# Patient Record
Sex: Female | Born: 1990
Health system: Southern US, Community
[De-identification: ages and names within clinical notes are randomized; demographics above are authoritative.]

## PROBLEM LIST (undated history)

## (undated) DIAGNOSIS — N63 Unspecified lump in unspecified breast: Secondary | ICD-10-CM

## (undated) DIAGNOSIS — F419 Anxiety disorder, unspecified: Secondary | ICD-10-CM

## (undated) DIAGNOSIS — G43909 Migraine, unspecified, not intractable, without status migrainosus: Secondary | ICD-10-CM

## (undated) DIAGNOSIS — A6000 Herpesviral infection of urogenital system, unspecified: Secondary | ICD-10-CM

## (undated) DIAGNOSIS — F329 Major depressive disorder, single episode, unspecified: Secondary | ICD-10-CM

## (undated) HISTORY — PX: CHOLECYSTECTOMY: SHX55

## (undated) HISTORY — DX: Herpesviral infection of urogenital system, unspecified: A60.00

## (undated) HISTORY — DX: Unspecified lump in unspecified breast: N63.0

## (undated) HISTORY — DX: Anxiety disorder, unspecified: F41.9

## (undated) HISTORY — DX: Major depressive disorder, single episode, unspecified: F32.9

---
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
## 2010-08-12 ENCOUNTER — Ambulatory Visit: Payer: Self-pay | Admitting: Family Medicine

## 2011-12-15 ENCOUNTER — Ambulatory Visit: Payer: Self-pay | Admitting: Emergency Medicine

## 2011-12-15 LAB — HEPATIC FUNCTION PANEL A (ARMC)
Albumin: 4.4 g/dL (ref 3.4–5.0)
Alkaline Phosphatase: 67 U/L (ref 50–136)
Bilirubin, Direct: 0.1 mg/dL (ref 0.00–0.20)
Bilirubin,Total: 0.5 mg/dL (ref 0.2–1.0)
SGOT(AST): 24 U/L (ref 15–37)
Total Protein: 8 g/dL (ref 6.4–8.2)

## 2011-12-16 ENCOUNTER — Ambulatory Visit: Payer: Self-pay | Admitting: Emergency Medicine

## 2011-12-19 LAB — PATHOLOGY REPORT

## 2012-03-25 LAB — HM PAP SMEAR: HM Pap smear: NEGATIVE

## 2013-02-16 IMAGING — CR DG CHOLANGIOGRAM OPERATIVE
1 series · 10 of 10 positions shown · non-contrast
Comparison: none

REASON FOR EXAM: Cholelithiasis
COMMENTS:

PROCEDURE:     DXR - DXR CHOLANGIOGRAM OP (INITIAL)  - December 16, 2011  [DATE]
RESULT:     Contrast is visualized in the common duct and hepatic ducts.
Contrast is noted to flow in the duodenum without evidence of obstruction.
No retained stone is seen. There is no dilatation of the common duct.

[Series 3: cont. · 10 of 39 frames shown]
[frame 1/39]
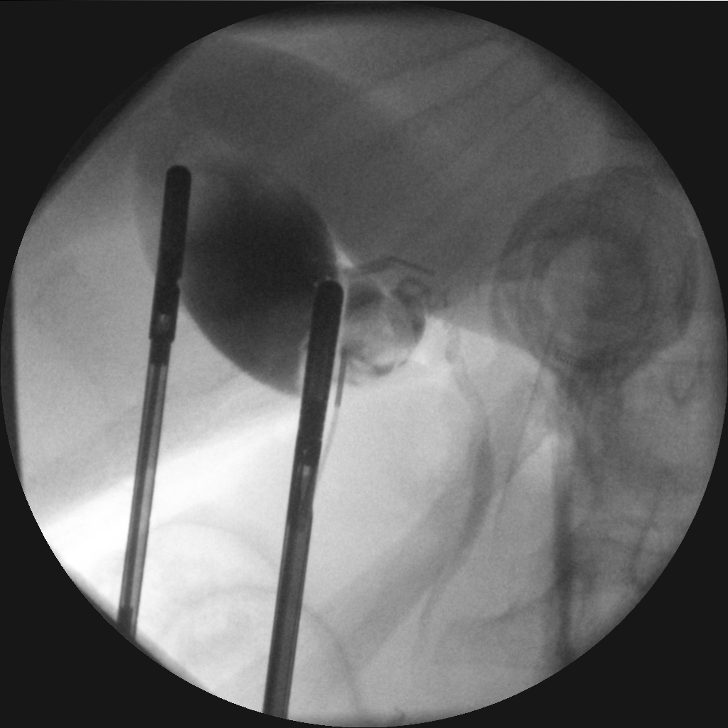
[frame 5/39]
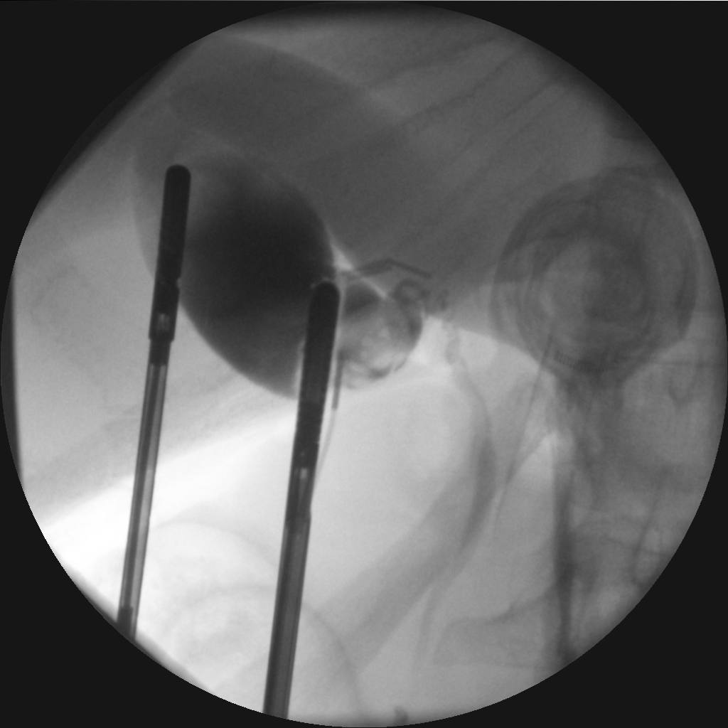
[frame 9/39]
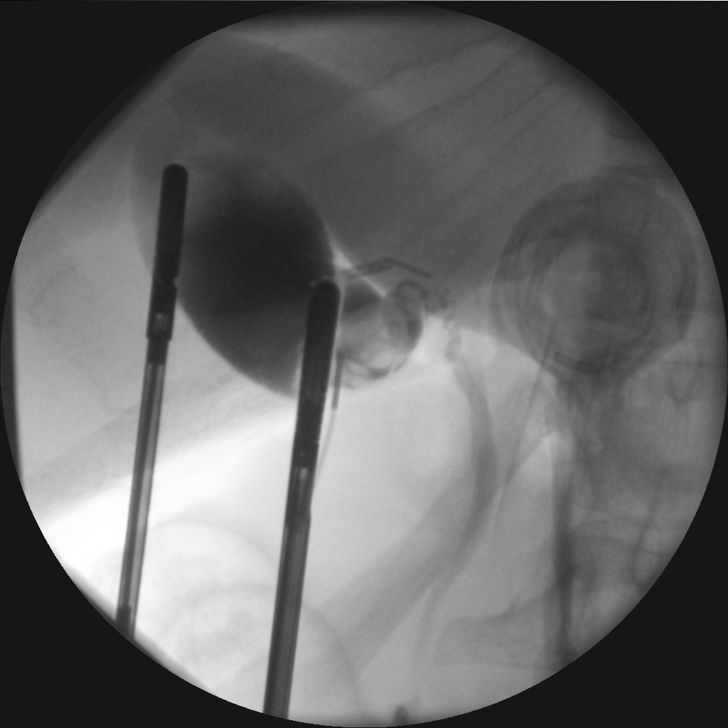
[frame 13/39]
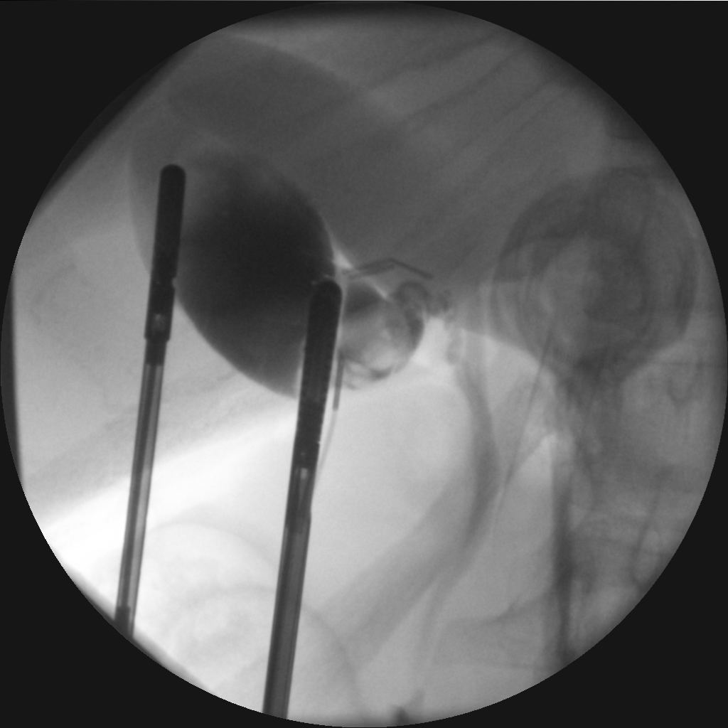
[frame 17/39]
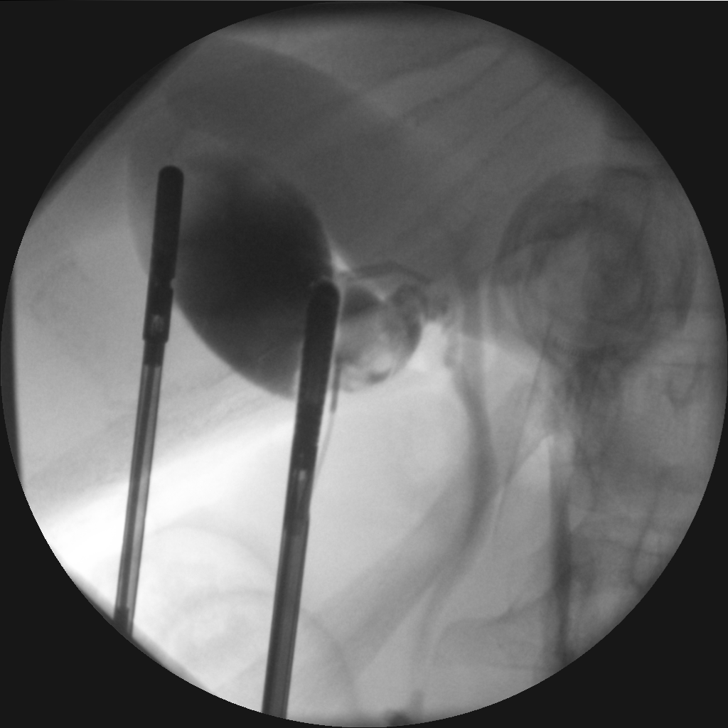
[frame 22/39]
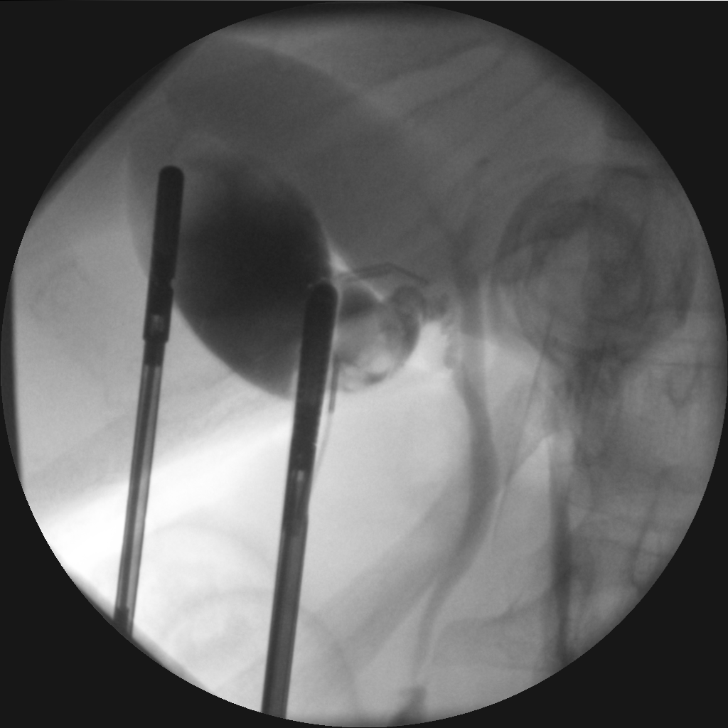
[frame 26/39]
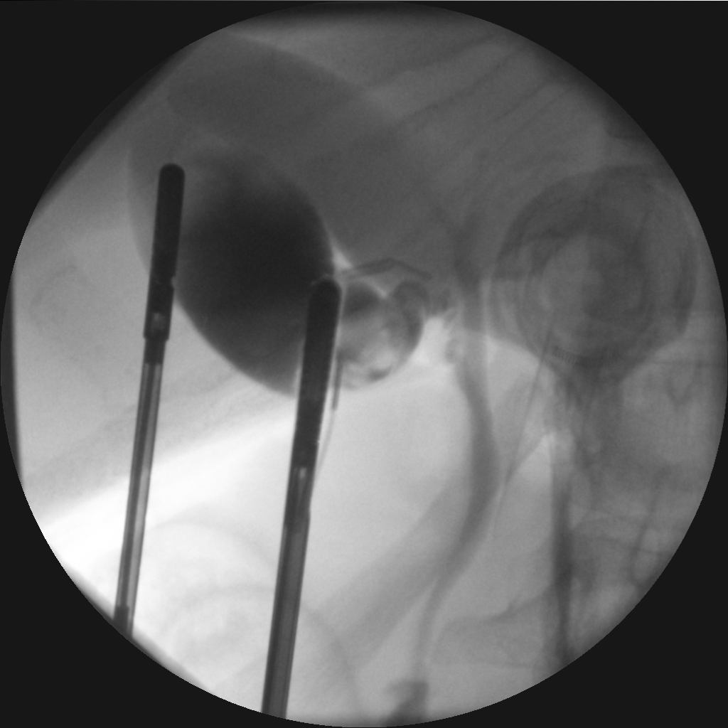
[frame 30/39]
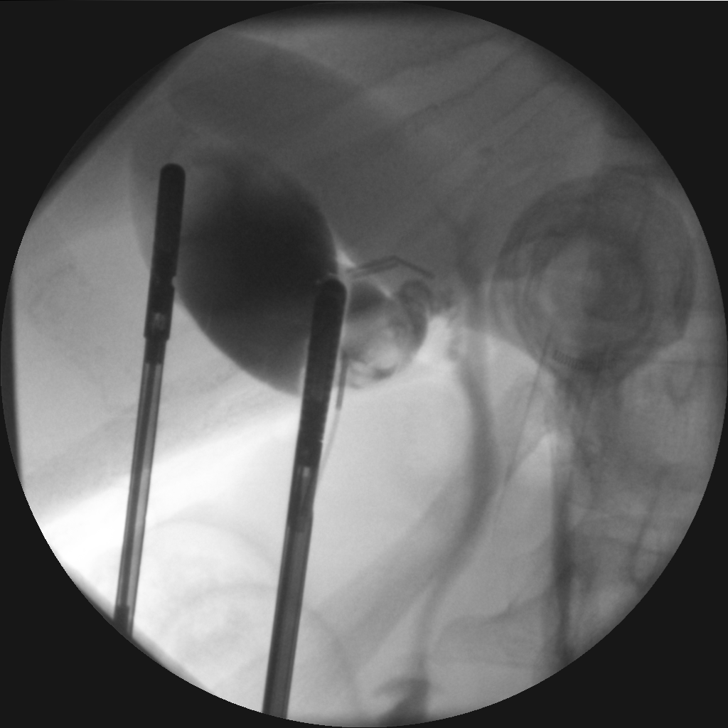
[frame 34/39]
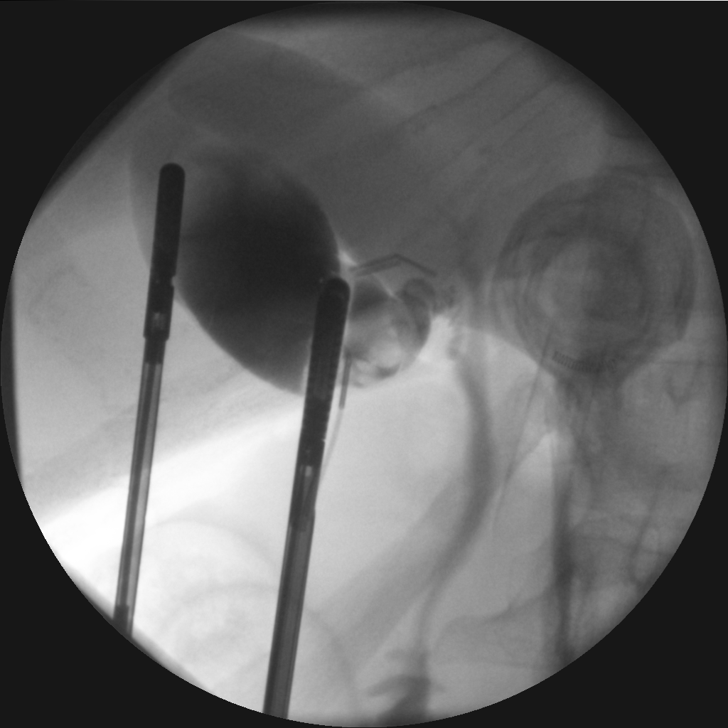
[frame 39/39]
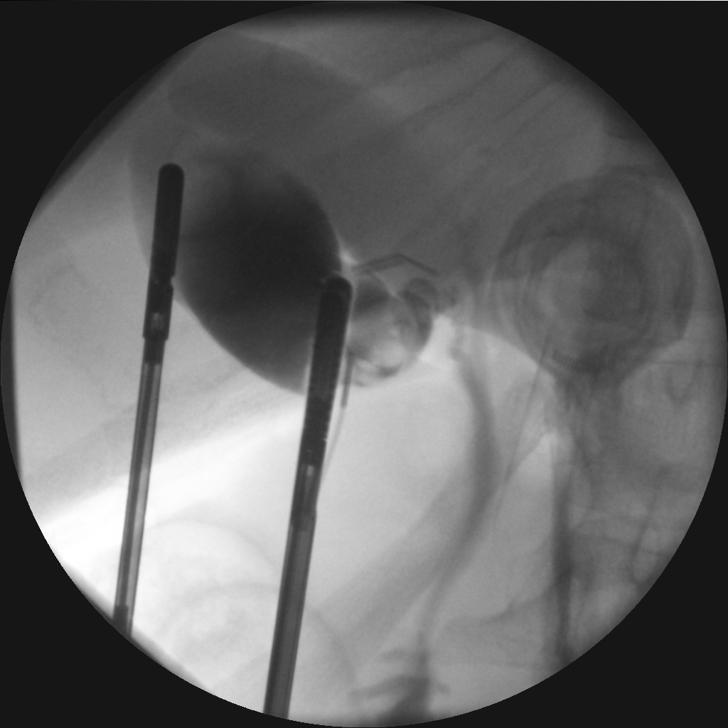

[10 of 10 positions shown; findings below may reference images not displayed]

IMPRESSION: 1.     Normal operative cholangiogram.

## 2013-03-20 ENCOUNTER — Other Ambulatory Visit: Payer: Self-pay

## 2013-03-20 LAB — HCG, QUANTITATIVE, PREGNANCY: Beta Hcg, Quant.: 9453 m[IU]/mL — ABNORMAL HIGH

## 2013-09-29 ENCOUNTER — Ambulatory Visit: Payer: Self-pay

## 2013-10-20 DIAGNOSIS — N63 Unspecified lump in unspecified breast: Secondary | ICD-10-CM

## 2013-10-20 HISTORY — DX: Unspecified lump in unspecified breast: N63.0

## 2013-11-02 ENCOUNTER — Observation Stay: Payer: Self-pay

## 2013-11-16 ENCOUNTER — Observation Stay: Payer: Self-pay

## 2013-11-19 ENCOUNTER — Inpatient Hospital Stay: Payer: Self-pay | Admitting: Obstetrics and Gynecology

## 2013-11-19 LAB — CBC WITH DIFFERENTIAL/PLATELET
BASOS PCT: 0.7 %
Basophil #: 0.1 10*3/uL (ref 0.0–0.1)
EOS PCT: 1.2 %
Eosinophil #: 0.2 10*3/uL (ref 0.0–0.7)
HCT: 34.2 % — AB (ref 35.0–47.0)
HGB: 11.1 g/dL — AB (ref 12.0–16.0)
LYMPHS PCT: 19 %
Lymphocyte #: 2.6 10*3/uL (ref 1.0–3.6)
MCH: 26.2 pg (ref 26.0–34.0)
MCHC: 32.5 g/dL (ref 32.0–36.0)
MCV: 81 fL (ref 80–100)
Monocyte #: 1.3 x10 3/mm — ABNORMAL HIGH (ref 0.2–0.9)
Monocyte %: 9.3 %
NEUTROS PCT: 69.8 %
Neutrophil #: 9.5 10*3/uL — ABNORMAL HIGH (ref 1.4–6.5)
Platelet: 220 10*3/uL (ref 150–440)
RBC: 4.23 10*6/uL (ref 3.80–5.20)
RDW: 14.8 % — ABNORMAL HIGH (ref 11.5–14.5)
WBC: 13.6 10*3/uL — AB (ref 3.6–11.0)

## 2013-11-20 LAB — GC/CHLAMYDIA PROBE AMP

## 2013-11-21 LAB — HEMATOCRIT: HCT: 28.1 % — ABNORMAL LOW (ref 35.0–47.0)

## 2013-11-26 ENCOUNTER — Ambulatory Visit: Payer: Self-pay | Admitting: Family Medicine

## 2013-11-26 LAB — RAPID INFLUENZA A&B ANTIGENS (ARMC ONLY)

## 2013-11-29 ENCOUNTER — Ambulatory Visit: Payer: Self-pay | Admitting: General Surgery

## 2013-12-01 ENCOUNTER — Other Ambulatory Visit: Payer: Medicaid Other

## 2013-12-01 ENCOUNTER — Encounter: Payer: Self-pay | Admitting: General Surgery

## 2013-12-01 ENCOUNTER — Ambulatory Visit (INDEPENDENT_AMBULATORY_CARE_PROVIDER_SITE_OTHER): Payer: Medicaid Other | Admitting: General Surgery

## 2013-12-01 VITALS — BP 98/62 | HR 72 | Resp 12 | Ht 67.0 in | Wt 186.0 lb

## 2013-12-01 DIAGNOSIS — N61 Mastitis without abscess: Secondary | ICD-10-CM

## 2013-12-01 NOTE — Progress Notes (Signed)
Patient ID: Nichole Winters, female   DOB: 1991-05-31, 23 y.o.   MRN: [id number]  Chief Complaint  Patient presents with  . Other    New Patient right breast mastitis on antibiotics, large mass at 4 o'clock right breast    HPI Nichole DunningsChristina R Winters is a 23 y.o. female.  Here today for breast evaluation. Referred by Dossie DerErika Howard NP for right breast mastitis on antibiotics, large mass at 4 o'clock right breast. She is postpartum 11 days. States she started having right breast pain last Friday and went to the ER on Saturday. She has been taking antibiotic since Monday. She states the redness has improved.  HPI  History reviewed. No pertinent past medical history.  Past Surgical History  Procedure Laterality Date  . Cholecystectomy      History reviewed. No pertinent family history.  Social History History  Substance Use Topics  . Smoking status: Former Smoker -- 1.00 packs/day for 1 years    Types: Cigarettes  . Smokeless tobacco: Never Used  . Alcohol Use: Yes    No Known Allergies  Current Outpatient Prescriptions  Medication Sig Dispense Refill  . dicloxacillin (DYNAPEN) 250 MG capsule Take 250 mg by mouth 4 (four) times daily.      . ferrous fumarate (HEMOCYTE - 106 MG FE) 325 (106 FE) MG TABS tablet Take 1 tablet by mouth.      Marland Kitchen. ibuprofen (ADVIL,MOTRIN) 100 MG tablet Take 100 mg by mouth every 6 (six) hours as needed for fever.      Marland Kitchen. oxyCODONE-acetaminophen (PERCOCET/ROXICET) 5-325 MG per tablet Take 1 tablet by mouth every 4 (four) hours as needed for severe pain.      . Prenatal Vit-Fe Fumarate-FA (PRENATAL PO) Take 2 tablets by mouth daily.       No current facility-administered medications for this visit.    Review of Systems Review of Systems  Constitutional: Negative.   Respiratory: Negative.   Cardiovascular: Negative.     Blood pressure 98/62, pulse 72, resp. rate 12, height 5\' 7"  (1.702 m), weight 186 lb (84.369 kg), last menstrual period  02/17/2013.  Physical Exam Physical Exam  Constitutional: She is oriented to person, place, and time. She appears well-developed and well-nourished.  Eyes: Conjunctivae are normal.  Cardiovascular: Normal rate, regular rhythm and normal heart sounds.   Pulmonary/Chest: Effort normal and breath sounds normal.  Both breast markedly engorged no sign of redness or sign of abscess.  Lymphadenopathy:    She has no cervical adenopathy.    She has no axillary adenopathy.  Neurological: She is alert and oriented to person, place, and time.  Skin: Skin is warm and dry.    Data Reviewed    Assessment    Mastitis by recent history- no findings of abscess or ongoing infection. Both breast markedly engorged, no sign of redness or sign of abscess.     Plan    Complete her course of antibiotic. Call for any new problems.       Aaren Atallah G 12/02/2013, 9:27 AM

## 2013-12-01 NOTE — Patient Instructions (Addendum)
Continue self breast exams. Call office for any new breast issues or concerns. Complete antibiotic therapy If redness or swelling worsens call and make appointment

## 2013-12-02 ENCOUNTER — Encounter: Payer: Self-pay | Admitting: General Surgery

## 2013-12-26 ENCOUNTER — Ambulatory Visit: Payer: Self-pay | Admitting: Family Medicine

## 2014-01-04 ENCOUNTER — Encounter: Payer: Self-pay | Admitting: General Surgery

## 2014-01-04 ENCOUNTER — Ambulatory Visit (INDEPENDENT_AMBULATORY_CARE_PROVIDER_SITE_OTHER): Payer: Medicaid Other | Admitting: General Surgery

## 2014-01-04 VITALS — BP 116/66 | HR 74 | Resp 12 | Ht 67.0 in | Wt 180.0 lb

## 2014-01-04 DIAGNOSIS — N644 Mastodynia: Secondary | ICD-10-CM

## 2014-01-04 NOTE — Progress Notes (Signed)
Patient ID: Nichole Winters, female   DOB: Jul 09, 1991, 23 y.o.   MRN: [id number]  Chief Complaint  Patient presents with  . Follow-up    right breast mass    HPI Nichole DunningsChristina R Winters is a 23 y.o. female who presents for a follow up evaluation of a right breast mass. The patient states the area is still the same size but is having pain in the area. The mass is located in the central outer quadrant of the right breast. The patient states she has stopped breast feeding. She states the pain is worse during her monthly cycle. The soreness has gone away since her period has stopped.   HPI  Past Medical History  Diagnosis Date  . Breast mass 2015    right breast    Past Surgical History  Procedure Laterality Date  . Cholecystectomy      History reviewed. No pertinent family history.  Social History History  Substance Use Topics  . Smoking status: Former Smoker -- 1.00 packs/day for 1 years    Types: Cigarettes  . Smokeless tobacco: Never Used  . Alcohol Use: Yes    No Known Allergies  Current Outpatient Prescriptions  Medication Sig Dispense Refill  . ibuprofen (ADVIL,MOTRIN) 100 MG tablet Take 100 mg by mouth every 6 (six) hours as needed for fever.       No current facility-administered medications for this visit.    Review of Systems Review of Systems  Constitutional: Negative.   Respiratory: Negative.   Cardiovascular: Negative.     Blood pressure 116/66, pulse 74, resp. rate 12, height 5\' 7"  (1.702 m), weight 180 lb (81.647 kg), last menstrual period 12/28/2013.  Physical Exam Physical Exam  Pulmonary/Chest: Right breast exhibits no inverted nipple, no mass, no nipple discharge, no skin change and no tenderness. Left breast exhibits no inverted nipple, no mass, no nipple discharge, no skin change and no tenderness.  Patient still has a little milk present.   Lymphadenopathy:    She has no cervical adenopathy.    She has no axillary adenopathy.  There is no  palpable mass, redness or tenderness in right breast  Data Reviewed  None.   Assessment    Stable exam with no findings of concern.     Plan    Patient to return as needed.        SANKAR,SEEPLAPUTHUR G 01/06/2014, 5:39 AM

## 2014-01-04 NOTE — Patient Instructions (Signed)
Patient to return as needed. The patient is aware to call back for any questions or concerns. 

## 2014-01-06 ENCOUNTER — Encounter: Payer: Self-pay | Admitting: General Surgery

## 2014-02-14 DIAGNOSIS — Z30430 Encounter for insertion of intrauterine contraceptive device: Secondary | ICD-10-CM | POA: Insufficient documentation

## 2014-08-21 ENCOUNTER — Encounter: Payer: Self-pay | Admitting: General Surgery

## 2015-02-11 NOTE — Op Note (Signed)
PATIENT NAME:  Nichole Winters, Nichole Winters MR#:  [id number] DATE OF BIRTH:  Mar 04, 1991  DATE OF PROCEDURE:  12/16/2011  PREOPERATIVE DIAGNOSIS: Acute cholecystitis, cholelithiasis.   POSTOPERATIVE DIAGNOSIS:  Acute cholecystitis, cholelithiasis.   PROCEDURE PERFORMED: Laparoscopic cholecystectomy with cholangiogram.   SURGEON:  M. Arnecia Ector, MD  INDICATIONS: This patient has been having a lot of abdominal pain for 2 to 3 months. Ultrasound showed gallstones. The patient was then seen by me in the office. She was having for the last one month continuous pain.  PROCEDURE: After the patient was put to sleep, a small incision was made above the umbilicus. After cutting skin and subcutaneous tissue, the fascia was cut and the peritoneum was entered under direct vision and trocar was inserted. After that CO2 was put in. Another trocar was put in the epigastric region.  Two 5-mm's were put in the right upper quadrant of the abdomen. The gallbladder had a lot of acute to chronic inflammation and dissection was done over the cystic duct/gallbladder junction. The cystic duct was short and dissection was done first. The cystic artery was then clipped and cut. Cystic duct was then isolated very nicely and cholangiogram was taken. It showed there was no stone in the common duct and the cystic duct was long. On the cholangiogram it looked longer. I stayed next to the gallbladder and applied three clips distally, one clip proximally, and took off the gallbladder. The gallbladder was intrahepatic and there was hardly any peritoneum, so there was some oozing from the lateral aspect of the liver with a small artery bleeding way up in the middle of the liver.  I stopped this with the Bovie and finally applied some clips and it stopped and then took off the gallbladder completely.  At the end we washed out this area and there was still some oozing so we fulgurated this area better and then put thrombin on top of it.  It looked good.  There was no bleeding at the time of finishing of surgery. Irrigation of the area was done copiously and there was no evidence of any more bleeding or leakage of bile anywhere. After that a piece of Surgicel was also put on top of this thrombin.  All the trocars were removed under direct vision. The umbilical port was then closed with interrupted 0 Vicryl sutures, 4-0 subcuticular suturing at the rest of the ports and Marcaine was injected.    The patient tolerated the procedure well and was sent to the recovery room in satisfactory condition.     ____________________________ Alton RevereMasud S. Cecelia ByarsHashmi, MD [id number] D: 12/16/2011 13:37:58 ET T: 12/16/2011 14:29:22 ET JOB#: [id number]  cc: Skyy Mcknight S. Cecelia ByarsHashmi, MD, <Dictator> Meindert A. Lacie ScottsNiemeyer, MD Meryle ReadyMASUD S Jemari Hallum MD ELECTRONICALLY SIGNED 12/18/2011 10:10

## 2015-02-27 NOTE — H&P (Signed)
L&D Evaluation:  History Expanded:  HPI 24 year old G2 P1001 with EDC=11/22/2013 by a 6wk1d ultrasound presents with c/o contractions since 6pm tonight. No vaginal bleeding. positive fetal movement, no leakage of fluid.  [facility] negative first trimester and msafp testing, a normal anatomy scan, a 50# weight gain, a hx of positive HSV II IGG, and GBS positive.  She has been taking valtrex for HSV since 10/25/13.  She denies any outbreaks.  States that she did not receive her influenza vaccine this pregnancy, but would have declined it if offered.  She received her TDaP vaccine this pregnancy.   Gravida 2   Term 1   PreTerm 0   Abortion 0   Living 1   Blood Type (Maternal) O positive   Group B Strep Results Maternal (Result >5wks must be treated as unknown) positive   Maternal HIV Negative   Maternal Syphilis Ab Nonreactive   Maternal Varicella Immune   Rubella Results (Maternal) immune   EDC 22-Nov-2013   Presents with contractions, leaking fluid   Patient's Medical History No Chronic Illness   Patient's Surgical History Laprascopic cholecystectomy   Medications Valtrex   Allergies oats, bee stings   Social History quit smoking at start of pregnancy   Family History Non-Contributory   ROS:  ROS All systems were reviewed.  HEENT, CNS, GI, GU, Respiratory, CV, Renal and Musculoskeletal systems were found to be normal., unless noted in HPI   Exam:  Vital Signs stable  T97.0, P 91, BP 113/67   Urine Protein not completed   General no apparent distress   Mental Status clear   Chest clear   Heart normal sinus rhythm   Abdomen gravid, non-tender   Estimated Fetal Weight Average for gestational age   Fetal Position vtx on Leopolds, sutures palpated on cervical exam   Back no CVAT   Edema no edema   Pelvic no external lesions, SSE: no Herpes lesions, Cervix 5cm per RN   Mebranes Intact   FHT normal rate with no decels, 130/mod  var/+accels/no decels   FHT Description Cat1   Ucx irregular, 3 q 10 min   Skin no lesions   Impression:  Impression reactive NST, 1) Intrauterine pregnancy at 39 weeks, 2) active labor   Plan:  Comments 1) admit 2) labor: expectant management for now 3) Fetal well being: reassuring. Cat 1 tracing.  Fetus ~7.5 pounds. 4) GBS+: amoxicillin stat 5) clears/IVF/CBC/T&S 6) HSV: no evidence of lesions. Will allow vaginal delivery.   Electronic Signatures: Conard NovakJackson, Javante Nilsson D (MD)  (Signed 31-Jan-15 22:55)  Authored: L&D Evaluation   Last Updated: 31-Jan-15 22:55 by Conard NovakJackson, Akhil Piscopo D (MD)

## 2015-02-27 NOTE — H&P (Signed)
L&D Evaluation:  History:  HPI 24 year old G2 P1001 with EDC=11/22/2013 by a 6wk1d ultrasound presents with c/o LOF and ctxs at 2150 last night. She noticed regular ctxs for not quite one hr, before they stopped and she feel asleep. This AM she reports ctxs q 30 min. No VB. Baby active. [facility] negative first trimester and msafp testing, a normal anatomy scan, a 50# weight gain, a hx of positive HSV II IGG, and GBS positive.   Presents with contractions, leaking fluid   Patient's Medical History "has a slow leak"   Patient's Surgical History Laprascopic cholecystectomy, IUD removal   Medications Valtrex   Allergies oats   Social History quit smoking at start of pregnancy   Family History Non-Contributory   ROS:  ROS see HPIU   Exam:  Vital Signs stable  110/68   Urine Protein not completed   General no apparent distress   Mental Status clear   Abdomen gravid, non-tender   Estimated Fetal Weight Average for gestational age   Fetal Position vtx on Leopolds   Pelvic no external lesions, SSE: neg pooling, fern, Nitrazine. CX1-2/60%/-2   Mebranes Intact   FHT normal rate with no decels, 135 with accels to 160s   FHT Description Cat1   Ucx one ctx in 40 min   Impression:  Impression reactive NST, IUP at 37 weeks, no evidence of SROM. Not in labor   Plan:  Plan DC home. Labor precautions. [facility]   Electronic Signatures: Trinna BalloonGutierrez, Anju Sereno L (CNM)  (Signed 14-Jan-15 13:53)  Authored: L&D Evaluation   Last Updated: 14-Jan-15 13:53 by Trinna BalloonGutierrez, Jhaniya Briski L (CNM)

## 2015-04-03 ENCOUNTER — Ambulatory Visit: Payer: Medicaid Other

## 2015-04-03 ENCOUNTER — Encounter: Payer: Self-pay | Admitting: Emergency Medicine

## 2015-04-03 ENCOUNTER — Ambulatory Visit
Admission: EM | Admit: 2015-04-03 | Discharge: 2015-04-03 | Disposition: A | Discharge status: Home / Self Care | Payer: Medicaid Other | Attending: Family Medicine | Admitting: Family Medicine

## 2015-04-03 ENCOUNTER — Ambulatory Visit
Admit: 2015-04-03 | Discharge: 2015-04-03 | Disposition: A | Discharge status: Home / Self Care | Payer: Medicaid Other | Attending: Internal Medicine | Admitting: Internal Medicine

## 2015-04-03 DIAGNOSIS — Z9049 Acquired absence of other specified parts of digestive tract: Secondary | ICD-10-CM | POA: Diagnosis not present

## 2015-04-03 DIAGNOSIS — F17210 Nicotine dependence, cigarettes, uncomplicated: Secondary | ICD-10-CM | POA: Diagnosis not present

## 2015-04-03 DIAGNOSIS — S82891A Other fracture of right lower leg, initial encounter for closed fracture: Secondary | ICD-10-CM | POA: Diagnosis not present

## 2015-04-03 DIAGNOSIS — M25571 Pain in right ankle and joints of right foot: Secondary | ICD-10-CM | POA: Diagnosis present

## 2015-04-03 DIAGNOSIS — Z79899 Other long term (current) drug therapy: Secondary | ICD-10-CM | POA: Diagnosis not present

## 2015-04-03 DIAGNOSIS — W1789XA Other fall from one level to another, initial encounter: Secondary | ICD-10-CM | POA: Insufficient documentation

## 2015-04-03 MED ORDER — HYDROCODONE-ACETAMINOPHEN 5-325 MG PO TABS
1.0000 | ORAL_TABLET | Freq: Four times a day (QID) | ORAL | Status: DC | PRN
Start: 2015-04-03 — End: 2015-10-11

## 2015-04-03 NOTE — ED Notes (Signed)
Pt reports fell into whole with right foot yesterday and is having right ankle pain and swelling

## 2015-04-03 NOTE — ED Notes (Signed)
Pt awaiting disc with radiology read

## 2015-04-03 NOTE — ED Provider Notes (Signed)
CSN: [id number]     Arrival date & time 04/03/15  1044 History   First MD Initiated Contact with Patient 04/03/15 1210     Chief Complaint  Patient presents with  . Ankle Pain   (Consider location/radiation/quality/duration/timing/severity/associated sxs/prior Treatment) HPI   Is a 24 year old female who while attempting to break into her father's house with his permission she fell out of the window landing onto the ground approximately 4 foot but her foot landed in a hole and she twisted her foot into inversion. This happened yesterday. Since that time she is found it to be very swollen tender is unable to be adequately bear weight. Most of the pain is lateral inferior to the lateral malleolus and also has some swelling and pain over her lateral midfoot and towards the base of the fifth metatarsal.  Past Medical History  Diagnosis Date  . Breast mass 2015    right breast   Past Surgical History  Procedure Laterality Date  . Cholecystectomy     No family history on file. History  Substance Use Topics  . Smoking status: Current Every Day Smoker -- 1.00 packs/day for 1 years    Types: Cigarettes  . Smokeless tobacco: Never Used  . Alcohol Use: Yes   OB History    Gravida Para Term Preterm AB TAB SAB Ectopic Multiple Living   Obstetric Comments   1st Menstrual Cycle:  13 1st Pregnancy:  17     Review of Systems  All other systems reviewed and are negative.   Allergies  Review of patient's allergies indicates no known allergies.  Home Medications   Prior to Admission medications   Medication Sig Start Date End Date Taking? Authorizing Provider  HYDROcodone-acetaminophen (NORCO/VICODIN) 5-325 MG per tablet Take 1 tablet by mouth every 6 (six) hours as needed for severe pain. 04/03/15   Lutricia Feil, PA-C  ibuprofen (ADVIL,MOTRIN) 100 MG tablet Take 100 mg by mouth every 6 (six) hours as needed for fever.    Historical Provider, MD   BP 107/67 mmHg   Pulse 79  Temp(Src) 97.1 F (36.2 C) (Tympanic)  Resp 16  Ht  (1.702 m)  Wt 148 lb (67.132 kg)  BMI 23.17 kg/m2  SpO2 99%  LMP 12/03/2013 Physical Exam  Constitutional: She is oriented to person, place, and time. She appears well-developed and well-nourished.  HENT:  Head: Normocephalic and atraumatic.  Eyes: EOM are normal. Pupils are equal, round, and reactive to light.  Neck: Normal range of motion. Neck supple.  Musculoskeletal:  Examination of the right foot and ankle has good range of motion of her ankle through a very limited range but is able to accomplish most of dorsiflexion and plantar flexion with encouragement. She has adequate subtalar motion. Maximal swelling is inferior to the lateral malleolus and over anteriorly over the anterior fibulotalar ligament. The calcaneus is nontender. She has some mild tenderness over the base of the fifth metatarsal as well as the fourth metatarsal base and less so on the forefoot left ankle and comparison is normal. Station is intact distally on the right. She is able to place some weight on the ankle with assistance but limps badly when she distributes her weight on the right.  Neurological: She is alert and oriented to person, place, and time. She has normal reflexes.  Skin: Skin is warm and dry.  Psychiatric: She has a normal mood  and affect. Her behavior is normal. Judgment and thought content normal.    ED Course  Procedures (including critical care time) Labs Review Labs Reviewed - No data to display  Imaging Review Dg Ankle Complete Right  04/03/2015   CLINICAL DATA:  Status post fall 06/02/2015 with twisting injury of the right ankle. Lateral pain and swelling. Initial encounter.  EXAM: RIGHT ANKLE - COMPLETE 3+ VIEW  COMPARISON:  None.  FINDINGS: Marked soft tissue swelling is seen about the lateral malleolus. There is an avulsion fracture off the lateral process of the talus. No other acute bony or joint abnormality is  identified.  IMPRESSION: Avulsion fracture lateral process of the talus with associated soft tissue swelling.   Electronically Signed   By: Drusilla Kanner M.D.   On: 04/03/2015 13:48   Dg Foot Complete Right  04/03/2015   CLINICAL DATA:  Fall with twisting injury. Pain at the base of the fifth metatarsal. Initial encounter.  EXAM: RIGHT FOOT COMPLETE - 3+ VIEW  COMPARISON:  12/10/2006  FINDINGS: Extension of the digits which is presumably positional. No evidence of fracture or dislocation. No acute soft tissue findings.  IMPRESSION: Negative.   Electronically Signed   By: Marnee Spring M.D.   On: 04/03/2015 12:50     MDM   1. Avulsion fracture of right ankle, closed, initial encounter    New Prescriptions   HYDROCODONE-ACETAMINOPHEN (NORCO/VICODIN) 5-325 MG PER TABLET    Take 1 tablet by mouth every 6 (six) hours as needed for severe pain.   Plan: 1. Test/x-ray results and diagnosis reviewed with patient Avulsion fracture of lateral talus right 2. rx as per orders; risks, benefits, potential side effects reviewed with patient 3. Recommend supportive treatment with ice/elevation. Crutches PRN. 4. F/u with Podiatry within 1 week.   Lutricia Feil, PA-C 04/03/15 1401

## 2015-09-27 ENCOUNTER — Encounter (HOSPITAL_COMMUNITY): Payer: Self-pay

## 2015-09-27 ENCOUNTER — Emergency Department (HOSPITAL_COMMUNITY): Payer: Medicaid Other

## 2015-09-27 ENCOUNTER — Emergency Department (HOSPITAL_COMMUNITY)
Admission: EM | Admit: 2015-09-27 | Discharge: 2015-09-27 | Disposition: A | Discharge status: Home / Self Care | Payer: Medicaid Other | Attending: Emergency Medicine | Admitting: Emergency Medicine

## 2015-09-27 DIAGNOSIS — Y9289 Other specified places as the place of occurrence of the external cause: Secondary | ICD-10-CM | POA: Insufficient documentation

## 2015-09-27 DIAGNOSIS — S8992XA Unspecified injury of left lower leg, initial encounter: Secondary | ICD-10-CM | POA: Diagnosis present

## 2015-09-27 DIAGNOSIS — S83005A Unspecified dislocation of left patella, initial encounter: Secondary | ICD-10-CM | POA: Diagnosis not present

## 2015-09-27 DIAGNOSIS — Y93K1 Activity, walking an animal: Secondary | ICD-10-CM | POA: Diagnosis not present

## 2015-09-27 DIAGNOSIS — Y998 Other external cause status: Secondary | ICD-10-CM | POA: Diagnosis not present

## 2015-09-27 DIAGNOSIS — F17210 Nicotine dependence, cigarettes, uncomplicated: Secondary | ICD-10-CM | POA: Diagnosis not present

## 2015-09-27 DIAGNOSIS — W010XXA Fall on same level from slipping, tripping and stumbling without subsequent striking against object, initial encounter: Secondary | ICD-10-CM | POA: Insufficient documentation

## 2015-09-27 DIAGNOSIS — Z8742 Personal history of other diseases of the female genital tract: Secondary | ICD-10-CM | POA: Insufficient documentation

## 2015-09-27 DIAGNOSIS — G43909 Migraine, unspecified, not intractable, without status migrainosus: Secondary | ICD-10-CM | POA: Insufficient documentation

## 2015-09-27 HISTORY — DX: Migraine, unspecified, not intractable, without status migrainosus: G43.909

## 2015-09-27 MED ORDER — IBUPROFEN 800 MG PO TABS: 800.0000 mg | ORAL_TABLET | Freq: Three times a day (TID) | ORAL | Status: DC | PRN

## 2015-09-27 MED ORDER — ONDANSETRON HCL 4 MG/2ML IJ SOLN
4.0000 mg | Freq: Once | INTRAMUSCULAR | Status: AC
  Administered 2015-09-27: 4 mg via INTRAVENOUS

## 2015-09-27 MED ORDER — ETOMIDATE 2 MG/ML IV SOLN
8.0000 mg | Freq: Once | INTRAVENOUS | Status: AC
  Administered 2015-09-27: 8 mg via INTRAVENOUS
  Filled 2015-09-27: qty 10

## 2015-09-27 MED ORDER — HYDROMORPHONE HCL 1 MG/ML IJ SOLN
1.0000 mg | Freq: Once | INTRAMUSCULAR | Status: AC
  Administered 2015-09-27: 1 mg via INTRAVENOUS
  Filled 2015-09-27: qty 1

## 2015-09-27 MED ORDER — LORAZEPAM 2 MG/ML IJ SOLN
1.0000 mg | Freq: Once | INTRAMUSCULAR | Status: AC
  Administered 2015-09-27: 1 mg via INTRAVENOUS
  Filled 2015-09-27: qty 1

## 2015-09-27 MED ORDER — ONDANSETRON HCL 4 MG/2ML IJ SOLN
INTRAMUSCULAR | Status: AC
  Filled 2015-09-27: qty 2

## 2015-09-27 NOTE — Discharge Instructions (Signed)
Follow up with dr. Harrison next week. °

## 2015-09-27 NOTE — ED Notes (Signed)
Pt was walking her dog and his leash got tangled around her left knee. Pt has dislocated left knee. Pt was given Fentanyl 75 mcg by EMS prior to arrival

## 2015-09-27 NOTE — ED Provider Notes (Addendum)
CSN: [id number]     Arrival date & time 09/27/15  1553 History   First MD Initiated Contact with Patient 09/27/15 1601     Chief Complaint  Patient presents with  . Knee Injury     (Consider location/radiation/quality/duration/timing/severity/associated sxs/prior Treatment) Patient is a 24 y.o. female presenting with knee pain. The history is provided by the patient (Patient fell and hurt her left knee.).  Knee Pain Location:  Leg Injury: yes   Mechanism of injury: fall   Fall:    Fall occurred:  Standing Associated symptoms: no back pain and no fatigue     Past Medical History  Diagnosis Date  . Breast mass 2015    right breast  . Migraine    Past Surgical History  Procedure Laterality Date  . Cholecystectomy     No family history on file. Social History  Substance Use Topics  . Smoking status: Current Every Day Smoker -- 1.00 packs/day for 1 years    Types: Cigarettes  . Smokeless tobacco: Never Used  . Alcohol Use: Yes   OB History    Gravida Para Term Preterm AB TAB SAB Ectopic Multiple Living   2 2        2       Obstetric Comments   1st Menstrual Cycle:  13 1st Pregnancy:  17     Review of Systems  Constitutional: Negative for appetite change and fatigue.  HENT: Negative for congestion, ear discharge and sinus pressure.   Eyes: Negative for discharge.  Respiratory: Negative for cough.   Cardiovascular: Negative for chest pain.  Gastrointestinal: Negative for abdominal pain and diarrhea.  Genitourinary: Negative for frequency and hematuria.  Musculoskeletal: Negative for back pain.       Knee pain  Skin: Negative for rash.  Neurological: Negative for seizures and headaches.  Psychiatric/Behavioral: Negative for hallucinations.      Allergies  Oat  Home Medications   Prior to Admission medications   Medication Sig Start Date End Date Taking? Authorizing Provider  Butalbital-APAP-Caffeine 50-300-40 MG CAPS Take 1 capsule by mouth 3 (three)  times daily as needed (headaches).  09/01/15  Yes Historical Provider, MD  HYDROcodone-acetaminophen (NORCO/VICODIN) 5-325 MG per tablet Take 1 tablet by mouth every 6 (six) hours as needed for severe pain. Patient not taking: Reported on 09/27/2015 04/03/15   Lutricia FeilWilliam P Roemer, PA-C  ibuprofen (ADVIL,MOTRIN) 800 MG tablet Take 1 tablet (800 mg total) by mouth every 8 (eight) hours as needed for moderate pain. 09/27/15   Bethann BerkshireJoseph Sylvanus Telford, MD   BP 115/87 mmHg  Pulse 93  Temp(Src) 98.6 F (37 C) (Oral)  Resp 19  Ht 5\' 7"  (1.702 m)  Wt 150 lb (68.04 kg)  BMI 23.49 kg/m2  SpO2 100%  LMP  Physical Exam  Constitutional: She is oriented to person, place, and time. She appears well-developed.  HENT:  Head: Normocephalic.  Eyes: Conjunctivae are normal.  Neck: No tracheal deviation present.  Cardiovascular:  No murmur heard. Musculoskeletal:  Patient has lateral dislocation of patella on left flank. Neurovascular exam normal  Neurological: She is oriented to person, place, and time.  Skin: Skin is warm.  Psychiatric: She has a normal mood and affect.    ED Course  Reduction of dislocation Date/Time: 09/27/2015 6:53 PM Performed by: Bethann BerkshireZAMMIT, Allysen Lazo Authorized by: Bethann BerkshireZAMMIT, Haniyah Maciolek Comments: Patient was given 8 mg of etomidate for sedation. Her lateral dislocation of her left patella was relocated without problems post reduction films normal   (including critical  care time) Labs Review Labs Reviewed - No data to display  Imaging Review Dg Knee Complete 4 Views Left  09/27/2015  CLINICAL DATA:  Postreduction patellar dislocation. EXAM: LEFT KNEE - COMPLETE 4+ VIEW COMPARISON:  Earlier the same date. FINDINGS: 1729 hours. Lateral patellar dislocation has been reduced. The patella is now located. There is a moderate size knee joint effusion without definite lipohemarthrosis. No evidence of acute fracture. IMPRESSION: Interval reduction of transient patellar dislocation injury with associated  hemarthrosis. No evidence of acute fracture. Electronically Signed   By: Carey Bullocks M.D.   On: 09/27/2015 17:48   Dg Knee Left Port  09/27/2015  CLINICAL DATA:  Left knee pain, status post fall EXAM: PORTABLE LEFT KNEE - 1-2 VIEW COMPARISON:  None. FINDINGS: No fracture or dislocation is seen. The joint spaces are preserved. Visualized soft tissues are within normal limits. No suprapatellar knee joint effusion. IMPRESSION: No fracture or dislocation is seen. Electronically Signed   By: Charline Bills M.D.   On: 09/27/2015 16:39   I have personally reviewed and evaluated these images and lab results as part of my medical decision-making.   EKG Interpretation None    procedural sedation pt given  etomidate.  Pt sedated 15 minutes  MDM   Final diagnoses:  Patellar dislocation, left, initial encounter    Dislocation of patella. Patient given knee immobilizer and crutches and Motrin for pain and will follow-up with orthopedics    Bethann Berkshire, MD 09/27/15 1610  Bethann Berkshire, MD 10/11/15 [phone]

## 2015-10-02 ENCOUNTER — Telehealth: Payer: Self-pay | Admitting: *Deleted

## 2015-10-02 NOTE — Telephone Encounter (Signed)
Patient called to schedule a appointment for her left knee, patient was seen in the ER 09/27/15. I asked patient if [place] pt confirmed that she did, I made her aware that we will need a referral from her primary care physician, patient said she will call her doctor for the referral

## 2015-10-11 ENCOUNTER — Ambulatory Visit (INDEPENDENT_AMBULATORY_CARE_PROVIDER_SITE_OTHER): Payer: Medicaid Other | Admitting: Orthopedic Surgery

## 2015-10-11 ENCOUNTER — Encounter: Payer: Self-pay | Admitting: *Deleted

## 2015-10-11 ENCOUNTER — Encounter: Payer: Self-pay | Admitting: Orthopedic Surgery

## 2015-10-11 VITALS — BP 106/69 | Ht 67.0 in | Wt 150.0 lb

## 2015-10-11 DIAGNOSIS — S83005A Unspecified dislocation of left patella, initial encounter: Secondary | ICD-10-CM | POA: Diagnosis not present

## 2015-10-11 MED ORDER — IBUPROFEN 800 MG PO TABS: 800.0000 mg | ORAL_TABLET | Freq: Three times a day (TID) | ORAL | Status: DC

## 2015-10-11 MED ORDER — HYDROCODONE-ACETAMINOPHEN 5-325 MG PO TABS: 1.0000 | ORAL_TABLET | Freq: Four times a day (QID) | ORAL | Status: DC | PRN

## 2015-10-11 NOTE — Patient Instructions (Signed)
Out of work note 09/27/15-11/01/15

## 2015-10-11 NOTE — Progress Notes (Signed)
  Nichole Winters is a 24 y.o. female   HPI:  Knee Pain: 24 year old female chief complaint of left knee pain  This 24 year old female fell while tending to her dog dislocated her left patella. She had closed reduction at [facility] presents for evaluation treatment and further management  Location of pain and swelling left knee quality dull ache a severity moderate to severe increased with weightbearing to severe pain duration 14 days time and constant context as described modified factors as described  Review of systems neurologic symptoms none fever chills none   Past Medical History  Diagnosis Date  . Breast mass 2015    right breast  . Migraine    Past Surgical History  Procedure Laterality Date  . Cholecystectomy      Current outpatient prescriptions:  .  Butalbital-APAP-Caffeine 50-300-40 MG CAPS, Take 1 capsule by mouth 3 (three) times daily as needed (headaches). , Disp: , Rfl: 2 .  ibuprofen (ADVIL,MOTRIN) 800 MG tablet, Take 1 tablet (800 mg total) by mouth every 8 (eight) hours as needed for moderate pain., Disp: 21 tablet, Rfl: 0 .  HYDROcodone-acetaminophen (NORCO/VICODIN) 5-325 MG tablet, Take 1 tablet by mouth every 6 (six) hours as needed for moderate pain., Disp: 120 tablet, Rfl: 0 .  ibuprofen (ADVIL,MOTRIN) 800 MG tablet, Take 1 tablet (800 mg total) by mouth 3 (three) times daily., Disp: 90 tablet, Rfl: 2 Allergies  Allergen Reactions  . Oat Anaphylaxis    reports that she has been smoking Cigarettes.  She has a 1 pack-year smoking history. She has never used smokeless tobacco. She reports that she drinks alcohol. She reports that she does not use illicit drugs. No family history on file.  Right Knee: Normal to inspection with no erythema or effusion or obvious bony abnormalities. Palpation normal with no warmth or joint line tenderness or patellar tenderness or condyle tenderness. ROM normal in flexion and extension and lower leg  rotation. Ligaments with solid consistent endpoints including ACL, PCL, LCL, MCL. Negative Mcmurray's and provocative meniscal tests. Non painful patellar compression. Patellar and quadriceps tendons unremarkable. Hamstring and quadriceps strength is normal.  Left knee her ambulatory pattern is supported by a straight leg brace. She has a large effusion. Knee flexion is approximately 45 and painful. The cruciate ligaments and collateral ligaments are stable she has apprehension with patellar stress test with medial retinacular tenderness motor exam normal skin intact pulses good sensation normal  X-rays show patellar dislocation and then the second set of x-rays I saw show patellar reduction no fracture  Patella dislocation initial left knee  Recommend knee brace with J support  Active range of motion  Hydrocodone and Norco for pain  Out of work 3 weeks from now including December 8 through January 7  Return in 3 weeks reevaluate for work status

## 2015-11-01 ENCOUNTER — Ambulatory Visit: Payer: Medicaid Other | Admitting: Orthopedic Surgery

## 2015-12-04 ENCOUNTER — Encounter: Payer: Self-pay | Admitting: Orthopedic Surgery

## 2015-12-04 ENCOUNTER — Ambulatory Visit (INDEPENDENT_AMBULATORY_CARE_PROVIDER_SITE_OTHER): Payer: Medicaid Other | Admitting: Orthopedic Surgery

## 2015-12-04 VITALS — BP 108/69 | Ht 67.0 in | Wt 150.0 lb

## 2015-12-04 DIAGNOSIS — M2342 Loose body in knee, left knee: Secondary | ICD-10-CM | POA: Diagnosis not present

## 2015-12-04 DIAGNOSIS — M25562 Pain in left knee: Secondary | ICD-10-CM | POA: Diagnosis not present

## 2015-12-04 MED ORDER — HYDROCODONE-ACETAMINOPHEN 5-325 MG PO TABS: 1.0000 | ORAL_TABLET | Freq: Four times a day (QID) | ORAL | Status: DC | PRN

## 2015-12-04 MED ORDER — IBUPROFEN 800 MG PO TABS: 800.0000 mg | ORAL_TABLET | Freq: Three times a day (TID) | ORAL | Status: DC

## 2015-12-04 NOTE — Patient Instructions (Signed)
We will schedule MRI for you and call you with appt. 

## 2015-12-04 NOTE — Addendum Note (Signed)
Addended by: Adella Hare B on: 12/04/2015 11:42 AM   Modules accepted: Orders

## 2015-12-04 NOTE — Progress Notes (Signed)
Patient ID: SYLIVA MEE, female   DOB: [birth year], 25 y.o.   MRN: [id number]  Chief Complaint  Patient presents with  . Follow-up    3 WEEK FOLLOW UP LEFT KNEE DISLOCATION, DOI 09/27/15    HPI Nichole Winters is a 25 y.o. female.  Status post injury to her left patella secondary to tending to her dog. Presents for reevaluation after treatment with brace hydrocodone and ibuprofen. She still complains of pain and lack of motion  Review of Systems Review of Systems 1. Denies numbness tingling 2. Denies fever chills   Past Medical History  Diagnosis Date  . Breast mass 2015    right breast  . Migraine     Past Surgical History  Procedure Laterality Date  . Cholecystectomy      Social History Social History  Substance Use Topics  . Smoking status: Current Every Day Smoker -- 1.00 packs/day for 1 years    Types: Cigarettes  . Smokeless tobacco: Never Used  . Alcohol Use: Yes    Allergies  Allergen Reactions  . Oat Anaphylaxis    Current Outpatient Prescriptions  Medication Sig Dispense Refill  . Butalbital-APAP-Caffeine 50-300-40 MG CAPS Take 1 capsule by mouth 3 (three) times daily as needed (headaches).   2  . HYDROcodone-acetaminophen (NORCO/VICODIN) 5-325 MG tablet Take 1 tablet by mouth every 6 (six) hours as needed for moderate pain. 56 tablet 0  . ibuprofen (ADVIL,MOTRIN) 800 MG tablet Take 1 tablet (800 mg total) by mouth 3 (three) times daily. 90 tablet 2   No current facility-administered medications for this visit.      Physical Exam Physical Exam Blood pressure 108/69, height  (1.702 m), weight 150 lb (68.04 kg).  Gen. appearance is ectomorphic The patient is alert and oriented person place and time Mood is normal affect is normal Ambulatory status and a tour with brace slight limp  Exam of the left knee  Inspection tenderness around the patella positive apprehension ROM 0-90 of flexion Stability patella stable but subluxate of  both Strength muscle tone normal  Skin: Normal without laceration  Pulses: 2+ DP  Neuro: Normal sensation  Normal range of motion right   Data Reviewed Previous x-ray shows no fracture  Assessment    Possible loose body in the left patella femoral joint after dislocation    Plan    Continue bracing  MRI left knee  Meds ordered this encounter  Medications  . DISCONTD: HYDROcodone-acetaminophen (NORCO/VICODIN) 5-325 MG tablet    Sig: Take 1 tablet by mouth every 6 (six) hours as needed for moderate pain.    Dispense:  56 tablet    Refill:  0  . ibuprofen (ADVIL,MOTRIN) 800 MG tablet    Sig: Take 1 tablet (800 mg total) by mouth 3 (three) times daily.    Dispense:  90 tablet    Refill:  2  . HYDROcodone-acetaminophen (NORCO/VICODIN) 5-325 MG tablet    Sig: Take 1 tablet by mouth every 6 (six) hours as needed for moderate pain.    Dispense:  56 tablet    Refill:  0          Fuller Canada 12/04/2015, 11:36 AM

## 2015-12-11 ENCOUNTER — Telehealth: Payer: Self-pay | Admitting: *Deleted

## 2015-12-11 NOTE — Telephone Encounter (Signed)
MEDICAID APPROVAL [id number]  MRI APPT 12/19/15 8:45AM @ [facility]  FOLLOW UP APPT IN OFFICE 12/25/15 8:30AM  CALLED PATIENT, NO ANSWER, LEFT VM

## 2015-12-19 ENCOUNTER — Ambulatory Visit (HOSPITAL_COMMUNITY)
Admission: RE | Admit: 2015-12-19 | Discharge: 2015-12-19 | Disposition: A | Discharge status: Home / Self Care | Payer: Medicaid Other | Source: Ambulatory Visit | Attending: Orthopedic Surgery | Admitting: Orthopedic Surgery

## 2015-12-19 DIAGNOSIS — M2342 Loose body in knee, left knee: Secondary | ICD-10-CM | POA: Insufficient documentation

## 2015-12-19 DIAGNOSIS — M7989 Other specified soft tissue disorders: Secondary | ICD-10-CM | POA: Diagnosis not present

## 2015-12-25 ENCOUNTER — Ambulatory Visit (INDEPENDENT_AMBULATORY_CARE_PROVIDER_SITE_OTHER): Payer: Medicaid Other | Admitting: Orthopedic Surgery

## 2015-12-25 ENCOUNTER — Encounter: Payer: Self-pay | Admitting: Orthopedic Surgery

## 2015-12-25 VITALS — BP 146/85 | Ht 66.0 in | Wt 140.0 lb

## 2015-12-25 DIAGNOSIS — M25562 Pain in left knee: Secondary | ICD-10-CM | POA: Diagnosis not present

## 2015-12-25 DIAGNOSIS — S83005D Unspecified dislocation of left patella, subsequent encounter: Secondary | ICD-10-CM | POA: Diagnosis not present

## 2015-12-25 NOTE — Patient Instructions (Signed)
WORK ON KNEE BENDS

## 2015-12-25 NOTE — Progress Notes (Signed)
Follow-up MRI left knee after patellar dislocation 09/27/2015, with reexamination left knee  she injured her knee while she was minding her dog. She was treated with ibuprofen and hydrocodone as well as a J hinged brace she was not improving and we center for MRI MRI shows torn medial patellofemoral ligament partial tear no loose body no other ligament injuries  She notes that her knee is feeling better she's having more flexion although not complete  Review of systems negative for numbness tingling fever or chills or weakness  Past Medical History  Diagnosis Date  . Breast mass 2015    right breast  . Migraine      BP 146/85 mmHg  Ht 5\' 6"  (1.676 m)  Wt 140 lb (63.504 kg)  BMI 22.61 kg/m2 She is awake alert and oriented 3 today her mood is pleasant her affect is normal her gait and station show normal heel-to-toe gait on the right slight limp on the left. Gen. appearance is well groomed.  We also see tenderness over the medial patellofemoral ligament knee flexion on the right is full knee flexion on the left is 120 15 difference ligaments and cruciate ligaments are stable she does have some apprehension with patellofemoral stress testing strength normal skin intact good pulses normal sensation distally   MRI reviewed with report. The menisci are normal and cruciate ligaments are intact she has  partial tearing of the medial patellofemoral ligament  Patellar spontaneous relocation after dislocation. Recommend continue J hinged brace at work. Continue to work on flexion exercises.   We will see her in an as-needed basis

## 2015-12-28 ENCOUNTER — Ambulatory Visit: Payer: Medicaid Other | Admitting: Orthopedic Surgery

## 2016-12-23 ENCOUNTER — Telehealth: Payer: Self-pay | Admitting: Obstetrics and Gynecology

## 2016-12-23 ENCOUNTER — Encounter: Payer: Self-pay | Admitting: Obstetrics and Gynecology

## 2016-12-23 ENCOUNTER — Ambulatory Visit (INDEPENDENT_AMBULATORY_CARE_PROVIDER_SITE_OTHER): Payer: Medicaid Other | Admitting: Obstetrics and Gynecology

## 2016-12-23 VITALS — BP 108/60 | Ht 66.0 in | Wt 166.0 lb

## 2016-12-23 DIAGNOSIS — Z30431 Encounter for routine checking of intrauterine contraceptive device: Secondary | ICD-10-CM | POA: Diagnosis not present

## 2016-12-23 DIAGNOSIS — N926 Irregular menstruation, unspecified: Secondary | ICD-10-CM

## 2016-12-23 DIAGNOSIS — Z113 Encounter for screening for infections with a predominantly sexual mode of transmission: Secondary | ICD-10-CM

## 2016-12-23 DIAGNOSIS — A6000 Herpesviral infection of urogenital system, unspecified: Secondary | ICD-10-CM

## 2016-12-23 DIAGNOSIS — Z01419 Encounter for gynecological examination (general) (routine) without abnormal findings: Secondary | ICD-10-CM | POA: Diagnosis not present

## 2016-12-23 DIAGNOSIS — Z8041 Family history of malignant neoplasm of ovary: Secondary | ICD-10-CM

## 2016-12-23 DIAGNOSIS — N930 Postcoital and contact bleeding: Secondary | ICD-10-CM | POA: Diagnosis not present

## 2016-12-23 DIAGNOSIS — F419 Anxiety disorder, unspecified: Secondary | ICD-10-CM

## 2016-12-23 DIAGNOSIS — F329 Major depressive disorder, single episode, unspecified: Secondary | ICD-10-CM

## 2016-12-23 HISTORY — DX: Herpesviral infection of urogenital system, unspecified: A60.00

## 2016-12-23 HISTORY — DX: Major depressive disorder, single episode, unspecified: F32.9

## 2016-12-23 HISTORY — DX: Anxiety disorder, unspecified: F41.9

## 2016-12-23 LAB — HPV APTIMA: HPV Aptima: NEGATIVE

## 2016-12-23 LAB — POCT URINE PREGNANCY: Preg Test, Ur: NEGATIVE

## 2016-12-23 MED ORDER — DOXYCYCLINE HYCLATE 100 MG PO CAPS: 100.0000 mg | ORAL_CAPSULE | Freq: Two times a day (BID) | ORAL | 0 refills | Status: DC

## 2016-12-23 NOTE — Progress Notes (Addendum)
HPI:      [name] is a 26 y.o. G2P2 who LMP was No LMP recorded. Patient is not currently having periods (Reason: IUD)., presents today for her annual examination.  Her menses are absent, but she has had postcoital bleeding and pain that lasts for 1-2 days for the past 1 1/2 months. Her husband has been unfaithful and she would like STD testing. Seh has also had episodic sharp, intermittent pain with activity since that time, too.  Mirena placed 02/14/14    She is single partner, contraception - IUD.  Last Pap: ?   Hx of STDs: HSV on labs but has never had outbreak There is no FH of breast cancer but her PGM had ovar ca. Genetic testing not done. She does not do SBE.  Tobacco use: The patient currently smokes 1/4 packs of cigarettes per day for the past many years. Alcohol use: none Exercise: moderately active  She does get adequate calcium and Vitamin D in her diet.  Past Medical History:  Diagnosis Date  . Anxiety 12/23/2016  . Breast mass 2015   right breast  . Genital herpes 12/23/2016  . Major depression 12/23/2016  . Migraine     Past Surgical History:  Procedure Laterality Date  . CHOLECYSTECTOMY      Family History  Problem Relation Age of Onset  . Ovarian cancer Paternal Grandmother 12  . Hypertension Paternal Grandmother   . CVA Paternal Grandmother   . Heart attack Paternal Grandmother   . Paranoid behavior Mother   . Schizophrenia Mother      ROS:  Review of Systems  Constitutional: Negative for fever, malaise/fatigue and weight loss.  HENT: Negative for congestion, ear pain and sinus pain.   Respiratory: Negative for cough, shortness of breath and wheezing.   Cardiovascular: Negative for chest pain, orthopnea and leg swelling.  Gastrointestinal: Negative for constipation, diarrhea, nausea and vomiting.  Genitourinary: Negative for dysuria, frequency, hematuria and urgency.       Breast ROS: positive for - tenderness    Musculoskeletal:  Negative for back pain, joint pain and myalgias.  Skin: Negative for itching and rash.  Neurological: Negative for dizziness, tingling, focal weakness and headaches.  Endo/Heme/Allergies: Negative for environmental allergies. Does not bruise/bleed easily.  Psychiatric/Behavioral: Negative for depression and suicidal ideas. The patient is not nervous/anxious and does not have insomnia.     Objective: BP 108/60   Ht 5' 6" (1.676 m)   Wt 166 lb (75.3 kg)   BMI 26.79 kg/m    Physical Exam  Constitutional: She is oriented to person, place, and time. She appears well-developed and well-nourished.  Genitourinary: Vagina normal and uterus normal. No erythema or tenderness in the vagina. No vaginal discharge found. Right adnexum does not display mass and does not display tenderness. Left adnexum does not display mass and does not display tenderness.  Cervix exhibits visible IUD strings. Cervix does not exhibit motion tenderness or polyp. Uterus is not enlarged or tender.  Neck: Normal range of motion. No thyromegaly present.  Cardiovascular: Normal rate, regular rhythm and normal heart sounds.   No murmur heard. Pulmonary/Chest: Effort normal and breath sounds normal. Right breast exhibits no mass, no nipple discharge, no skin change and no tenderness. Left breast exhibits no mass, no nipple discharge, no skin change and no tenderness.  Abdominal: Soft. There is no tenderness. There is no guarding.  Musculoskeletal: Normal range of motion.  Neurological: She is alert and oriented to person, place,  and time. No cranial nerve deficit.  Psychiatric: She has a normal mood and affect. Her behavior is normal.  Vitals reviewed.   Results: Results for orders placed or performed in visit on 12/23/16 (from the past 24 hour(s))  POCT urine pregnancy     Status: Normal   Collection Time: 12/23/16  3:12 PM  Result Value Ref Range   Preg Test, Ur Negative Negative    Assessment: Encounter for annual  routine gynecological examination - Plan: IGP,CtNgTv,rfx Aptima HPV ASCU  Irregular bleeding - Plan: POCT urine pregnancy  Encounter for routine checking of intrauterine contraceptive device (IUD) - IUD in place. Due for 4/20.  Postcoital bleeding - Neg UPT. Check nuswab. Will f/u with results. Rx doxy to treat empirically. F/u prn sx.  - Plan: doxycycline (VIBRAMYCIN) 100 MG capsule  Screening for STD (sexually transmitted disease) - Plan: IGP,CtNgTv,rfx Aptima HPV ASCU, HIV antibody, RPR, Hepatitis C antibody  Family history of ovarian cancer - Pt qualifies for My Risk testing. Will f/u when medicaid active for lab. - Plan: CANCELED: [facility] ([facility])    Plan:            GYN counsel STD prevention, adequate intake of calcium and vitamin D     F/U  Return in about 1 year (around 12/23/2017), or if symptoms worsen or fail to improve.  Karolynn Infantino B. Brooks Stotz, PA-C 12/23/2016 4:07 PM

## 2016-12-23 NOTE — Addendum Note (Signed)
Addended by: Althea GrimmerOPLAND, ALICIA B on: 12/23/2016 04:07 PM   Modules accepted: Orders

## 2016-12-23 NOTE — Telephone Encounter (Signed)
Note opened in error.

## 2016-12-24 NOTE — Telephone Encounter (Signed)
This encounter was created in error - please disregard.

## 2016-12-24 NOTE — Addendum Note (Signed)
Addended by: Althea GrimmerOPLAND, Jaeceon Michelin B on: 12/24/2016 09:30 AM   Modules accepted: Level of Service, SmartSet

## 2016-12-25 ENCOUNTER — Telehealth: Payer: Self-pay | Admitting: Obstetrics and Gynecology

## 2016-12-25 LAB — IGP,CTNGTV,RFX APTIMA HPV ASCU
Chlamydia, Nuc. Acid Amp: NEGATIVE
Gonococcus, Nuc. Acid Amp: NEGATIVE
PAP Smear Comment: 0
TRICH VAG BY NAA: NEGATIVE

## 2016-12-25 NOTE — Telephone Encounter (Signed)
LM for pt with neg paptima results. Complete doxy Rx and f/u if postcoital bleeding sx persist.

## 2018-02-09 ENCOUNTER — Other Ambulatory Visit: Payer: Self-pay

## 2018-02-09 ENCOUNTER — Ambulatory Visit
Admission: EM | Admit: 2018-02-09 | Discharge: 2018-02-09 | Disposition: A | Discharge status: Home / Self Care | Payer: No Typology Code available for payment source | Attending: Family Medicine | Admitting: Family Medicine

## 2018-02-09 DIAGNOSIS — S91111A Laceration without foreign body of right great toe without damage to nail, initial encounter: Secondary | ICD-10-CM | POA: Diagnosis not present

## 2018-02-09 DIAGNOSIS — Z23 Encounter for immunization: Secondary | ICD-10-CM | POA: Diagnosis not present

## 2018-02-09 MED ORDER — TETANUS-DIPHTH-ACELL PERTUSSIS 5-2.5-18.5 LF-MCG/0.5 IM SUSP
0.5000 mL | Freq: Once | INTRAMUSCULAR | Status: AC
  Administered 2018-02-09: 0.5 mL via INTRAMUSCULAR

## 2018-02-09 MED ORDER — MUPIROCIN 2 % EX OINT: 1.0000 "application " | TOPICAL_OINTMENT | Freq: Three times a day (TID) | CUTANEOUS | 0 refills | Status: DC

## 2018-02-09 NOTE — ED Notes (Signed)
Bacitracin applied to right great toe and wrapped in kerlix

## 2018-02-09 NOTE — ED Provider Notes (Addendum)
[facility]    CSN: [id number] Arrival date & time: 02/09/18  0820     History   Chief Complaint Chief Complaint  Patient presents with  . Laceration    HPI Nichole Winters is a 27 y.o. female.   HPI  27 year old female presents with a laceration to the tip of her right toe happened on Easter Sunday as she was removing a ham from the oven.  She spilled some of the juices from the ham on her foot and was concerned about a burn.  She did not realize that she had cut her toe on a metal vent cover.  Not seek medical attention at first but last night at work she had increasing pain and decided to come in today for a tetanus shot and evaluation.        Past Medical History:  Diagnosis Date  . Anxiety 12/23/2016  . Breast mass 2015   right breast  . Genital herpes 12/23/2016  . Major depression 12/23/2016  . Migraine     Patient Active Problem List   Diagnosis Date Noted  . Anxiety 12/23/2016  . Major depression 12/23/2016  . Genital herpes 12/23/2016  . Encounter for IUD insertion 02/14/2014    Past Surgical History:  Procedure Laterality Date  . CHOLECYSTECTOMY      OB History    Gravida  2   Para  2   Term  1   Preterm      AB      Living  2     SAB      TAB      Ectopic      Multiple      Live Births  2        Obstetric Comments  1st Menstrual Cycle:  13 1st Pregnancy:  17         Home Medications    Prior to Admission medications   Medication Sig Start Date End Date Taking? Authorizing Provider  levonorgestrel (MIRENA) 20 MCG/24HR IUD 1 each by Intrauterine route once.   Yes [provider]  mupirocin ointment (BACTROBAN) 2 % Apply 1 application topically 3 (three) times daily. 02/09/18   Lutricia Feil, PA-C    Family History Family History  Problem Relation Age of Onset  . Ovarian cancer Paternal Grandmother 35  . Hypertension Paternal Grandmother   . CVA Paternal Grandmother   . Heart attack  Paternal Grandmother   . Paranoid behavior Mother   . Schizophrenia Mother     Social History Social History   Tobacco Use  . Smoking status: Current Every Day Smoker    Packs/day: 0.25    Years: 1.00    Pack years: 0.25    Types: Cigarettes  . Smokeless tobacco: Never Used  Substance Use Topics  . Alcohol use: Yes    Comment: social  . Drug use: Never     Allergies   Oat   Review of Systems Review of Systems  Constitutional: Positive for activity change. Negative for chills, fatigue and fever.  Skin: Positive for wound.  All other systems reviewed and are negative.    Physical Exam Triage Vital Signs ED Triage Vitals [02/09/18 0848]  Enc Vitals Group     BP      Pulse      Resp      Temp      Temp src      SpO2      Weight 168 lb (  76.2 kg)     Height 5\' 7"  (1.702 m)     Head Circumference      Peak Flow      Pain Score 8     Pain Loc      Pain Edu?      Excl. in GC?    No data found.  Updated Vital Signs Ht 5\' 7"  (1.702 m)   Wt 168 lb (76.2 kg)   BMI 26.31 kg/m   Visual Acuity Right Eye Distance:   Left Eye Distance:   Bilateral Distance:    Right Eye Near:   Left Eye Near:    Bilateral Near:     Physical Exam  Constitutional: She is oriented to person, place, and time. She appears well-developed and well-nourished. No distress.  HENT:  Head: Normocephalic.  Eyes: Pupils are equal, round, and reactive to light.  Neck: Normal range of motion.  Musculoskeletal: Normal range of motion.  Neurological: She is alert and oriented to person, place, and time.  Skin: Skin is warm and dry. She is not diaphoretic.  Examination of the right great toe shows a transverse oriented laceration extending approximately 1 1/2 cm in length.  It barely extends into the subcutaneous tissues.  No warmth there is no erythema no swelling.  Tissues are soft there is no induration or fluctuance present.  There is no drainage from the wound.  Psychiatric: She has a  normal mood and affect. Her behavior is normal. Judgment and thought content normal.  Nursing note and vitals reviewed.    UC Treatments / Results  Labs (all labs ordered are listed, but only abnormal results are displayed) Labs Reviewed - No data to display  EKG None Radiology No results found.  Procedures Procedures (including critical care time)  Medications Ordered in UC Medications  Tdap (BOOSTRIX) injection 0.5 mL (0.5 mLs Intramuscular Given 02/09/18 0858)     Initial Impression / Assessment and Plan / UC Course  I have reviewed the triage vital signs and the nursing notes.  Pertinent labs & imaging results that were available during my care of the patient were reviewed by me and considered in my medical decision making (see chart for details).     Plan: 1. Test/x-ray results and diagnosis reviewed with patient 2. rx as per orders; risks, benefits, potential side effects reviewed with patient 3. Recommend supportive treatment with elevation as necessary to control pain.  Keep the wound clean and dry.  Apply Bactroban ointment 3 times daily until healed.  Commended using ibuprofen 400 mg combined with Tylenol 500 mg every 6 hours as necessary for pain.  If any signs or symptoms of infection occur she should return to our clinic.  These were outlined in detail for the patient and also given written information 4. F/u prn if symptoms worsen or don't improve   Final Clinical Impressions(s) / UC Diagnoses   Final diagnoses:  Laceration of great toe of right foot without complication, initial encounter    ED Discharge Orders        Ordered    mupirocin ointment (BACTROBAN) 2 %  3 times daily     02/09/18 0926       Controlled Substance Prescriptions [place] Controlled Substance Registry consulted? Not Applicable   Lutricia FeilRoemer, Mikiya Nebergall P, PA-C 02/09/18 0936    Lutricia Feiloemer, Mikelle Myrick P, PA-C 02/09/18 [phone]

## 2018-02-09 NOTE — ED Triage Notes (Signed)
Pt with laceration to right great toe on Easter Sunday on a metal vent cover. Pain 8/10

## 2018-02-09 NOTE — ED Notes (Signed)
Right great toe cleansed with betadine and saline

## 2018-10-21 ENCOUNTER — Ambulatory Visit (INDEPENDENT_AMBULATORY_CARE_PROVIDER_SITE_OTHER): Payer: Self-pay | Admitting: Physician Assistant

## 2018-10-21 ENCOUNTER — Encounter: Payer: Self-pay | Admitting: Physician Assistant

## 2018-10-21 VITALS — BP 120/80 | HR 87 | Temp 98.6°F | Ht 65.0 in | Wt 156.0 lb

## 2018-10-21 DIAGNOSIS — J101 Influenza due to other identified influenza virus with other respiratory manifestations: Secondary | ICD-10-CM

## 2018-10-21 DIAGNOSIS — R6889 Other general symptoms and signs: Secondary | ICD-10-CM

## 2018-10-21 LAB — POCT INFLUENZA A/B
INFLUENZA A, POC: NEGATIVE
Influenza B, POC: POSITIVE — AB

## 2018-10-21 MED ORDER — OSELTAMIVIR PHOSPHATE 75 MG PO CAPS: 75.0000 mg | ORAL_CAPSULE | Freq: Two times a day (BID) | ORAL | 0 refills | Status: AC

## 2018-10-21 MED ORDER — PSEUDOEPH-BROMPHEN-DM 30-2-10 MG/5ML PO SYRP: 5.0000 mL | ORAL_SOLUTION | Freq: Four times a day (QID) | ORAL | 0 refills | Status: DC | PRN

## 2018-10-21 MED ORDER — FLUTICASONE PROPIONATE 50 MCG/ACT NA SUSP: 2.0000 | Freq: Every day | NASAL | 0 refills | Status: DC

## 2018-10-21 NOTE — Progress Notes (Signed)
Patient ID: Nichole Winters DOB: [birth year] AGE: 28 y.o. MRN: [id number]   PCP: Evelene Croon, MD   Chief Complaint:  Chief Complaint  Patient presents with  . Cough    x2d  . Sore Throat    x1d  . Nausea    x1d  . Fever    x1d     Subjective:    HPI:  ANGALEE Winters is a 28 y.o. female presents for evaluation  Chief Complaint  Patient presents with  . Cough    x2d  . Sore Throat    x1d  . Nausea    x1d  . Fever    x66d    28 year old female presents to [facility] with three day history of URI symptoms. Began with coarse/junky cough. Then developed nasal congestion, rhinorrhea, PND, and sore throat. Worsening cough. Yesterday developed fever (tmax 99.40F), chills, body aches, headache, malaise, and nausea/vomiting. Has taken OTC generic cough & cold medication and Tylenol with minimal relief. Denies dizziness/lightheadedness, ear pain, sinus pain, chest pain, SOB, wheezing, abdominal pain, diarrhea. Patient works in healthcare; on the floor at the hospital. Multiple close contacts with influenza. Patient did receive this season's influenza vaccination. Patient denies asthma history. Patient is a cigarette smoker; averages 1/4ppd.  A limited review of symptoms was performed, pertinent positives and negatives as mentioned in HPI.  The following portions of the patient's history were reviewed and updated as appropriate: allergies, current medications and past medical history.  Patient Active Problem List   Diagnosis Date Noted  . Anxiety 12/23/2016  . Major depression 12/23/2016  . Genital herpes 12/23/2016  . Encounter for IUD insertion 02/14/2014    Allergies  Allergen Reactions  . Oat Anaphylaxis    Current Outpatient Medications on File Prior to Visit  Medication Sig Dispense Refill  . levonorgestrel (MIRENA) 20 MCG/24HR IUD 1 each by Intrauterine route once.     No current facility-administered medications on file prior to visit.         Objective:   Vitals:   10/21/18 1433  BP: 120/80  Pulse: 87  Temp: 98.6 F (37 C)  SpO2: 97%     Wt Readings from Last 3 Encounters:  10/21/18 156 lb (70.8 kg)  02/09/18 168 lb (76.2 kg)  12/23/16 166 lb (75.3 kg)    Physical Exam:   General Appearance:  Patient sitting comfortably on examination table. Conversational. Peri [place] self-historian. In no acute distress. Afebrile. Patient appears mildly ill/uncomfortable.  Head:  Normocephalic, without obvious abnormality, atraumatic  Eyes:  PERRL, conjunctiva/corneas clear, EOM's intact  Ears:  Bilateral ear canals WNL. No erythema or edema. No discharge/drainage. Bilateral TMs WNL. No erythema, injection, or serous effusion. No scar tissue.  Nose: Nares normal, septum midline. Nasal mucosa reveals bilateral edema with thick yellow nasal discharge. Nasally sounding voice. No sinus tenderness with percussion/palpation.  Throat: Lips, mucosa, and tongue normal; teeth and gums normal. Throat reveals no erythema. Mild cobblestoning appearance in posterior pharynx. Tonsils with no enlargement or exudate.  Neck: Supple, symmetrical, trachea midline, bilateral anterior lymphadenopathy (tender to palpation)  Lungs:   Clear to auscultation bilaterally, respirations unlabored. No wheezing, rales, rhonchi, or crackles. Mildly poor respiratory effort; poor inspiration. Harsh/coarse cough elicited with deep inspiration.  Heart:  Regular rate and rhythm, S1 and S2 normal, no murmur, rub, or gallop  Abdomen:   Soft, non-tender, bowel sounds active all four quadrants, no masses, no organomegaly  Extremities: Extremities normal, atraumatic, no cyanosis or edema  Pulses: 2+ and symmetric  Skin: Skin color, texture, turgor normal, no rashes or lesions  Lymph nodes: Cervical, supraclavicular, and axillary nodes normal  Neurologic: Normal    Assessment & Plan:    Exam findings, diagnosis etiology and medication use and indications reviewed with  patient. Follow-Up and discharge instructions provided. No emergent/urgent issues found on exam.  Patient education was provided.   Patient verbalized understanding of information provided and agrees with plan of care (POC), all questions answered. The patient is advised to call or return to clinic if condition does not see an improvement in symptoms, or to seek the care of the closest emergency department if condition worsens with the below plan.     1. Influenza B  - oseltamivir (TAMIFLU) 75 MG capsule; Take 1 capsule (75 mg total) by mouth 2 (two) times daily for 5 days.  Dispense: 10 capsule; Refill: 0 - fluticasone (FLONASE) 50 MCG/ACT nasal spray; Place 2 sprays into both nostrils daily.  Dispense: 16 g; Refill: 0 - brompheniramine-pseudoephedrine-DM 30-2-10 MG/5ML syrup; Take 5 mLs by mouth 4 (four) times daily as needed.  Dispense: 120 mL; Refill: 0  2. Flu-like symptoms  - POCT Influenza A/B  Patient with 3 day history of URI/flu-like symptoms. Known flu exposure. Rapid flu test performed in office positive. Patient prescribed 5-day course of Tamiflu 75mg  bid, Flonase nasal spray, and Bromphed cough syrup. Advised rest, increase fluids, and Tylenol/ibuprofen for fever. Gave patient work excuse note. Discussed contagiousness precautions. Advised patient return to clinic in one week if symptoms not improving, sooner with any worsening symptoms.    Janalyn HarderSamantha Gilles Trimpe, MHS, PA-C Rulon SeraSamantha F. Jarae Panas, MHS, PA-C Advanced Practice Provider [facility]  [facility]  [address], [facility], 1st Floor [place], [place] [address] (p):  [phone] [email or web address]

## 2018-10-21 NOTE — Patient Instructions (Signed)
Thank you for choosing [facility] for your health care needs.  You have been diagnosed with influenza. Positive on rapid flu test for flu test B.  Recommend increase fluids. Rest. Take Tylenol or ibuprofen for pain/fever.  You are contagious. You are most contagious the first week of symptoms. Wash hands regularly. Cough in to elbow. Clean work station with antibacterial wipes.  Return to [facility] or follow-up with family physician or urgent care if symptoms not improving in one week, sooner with any worsening symptoms.  Influenza, Adult Influenza is also called "the flu." It is an infection in the lungs, nose, and throat (respiratory tract). It is caused by a virus. The flu causes symptoms that are similar to symptoms of a cold. It also causes a high fever and body aches. The flu spreads easily from person to person (is contagious). Getting a flu shot (influenza vaccination) every year is the best way to prevent the flu. What are the causes? This condition is caused by the influenza virus. You can get the virus by:  Breathing in droplets that are in the air from the cough or sneeze of a person who has the virus.  Touching something that has the virus on it (is contaminated) and then touching your mouth, nose, or eyes. What increases the risk? Certain things may make you more likely to get the flu. These include:  Not washing your hands often.  Having close contact with many people during cold and flu season.  Touching your mouth, eyes, or nose without first washing your hands.  Not getting a flu shot every year. You may have a higher risk for the flu, along with serious problems such as a lung infection (pneumonia), if you:  Are older than 65.  Are pregnant.  Have a weakened disease-fighting system (immune system) because of a disease or taking certain medicines.  Have a long-term (chronic) illness, such as: ? Heart, kidney, or lung disease. ? Diabetes. ? Asthma.  Have a  liver disorder.  Are very overweight (morbidly obese).  Have anemia. This is a condition that affects your red blood cells. What are the signs or symptoms? Symptoms usually begin suddenly and last 4-14 days. They may include:  Fever and chills.  Headaches, body aches, or muscle aches.  Sore throat.  Cough.  Runny or stuffy (congested) nose.  Chest discomfort.  Not wanting to eat as much as normal (poor appetite).  Weakness or feeling tired (fatigue).  Dizziness.  Feeling sick to your stomach (nauseous) or throwing up (vomiting). How is this treated? If the flu is found early, you can be treated with medicine that can help reduce how bad the illness is and how long it lasts (antiviral medicine). This may be given by mouth (orally) or through an IV tube. Taking care of yourself at home can help your symptoms get better. Your doctor may suggest:  Taking over-the-counter medicines.  Drinking plenty of fluids. The flu often goes away on its own. If you have very bad symptoms or other problems, you may be treated in a hospital. Follow these instructions at home:     Activity  Rest as needed. Get plenty of sleep.  Stay home from work or school as told by your doctor. ? Do not leave home until you do not have a fever for 24 hours without taking medicine. ? Leave home only to visit your doctor. Eating and drinking  Take an ORS (oral rehydration solution). This is a drink that is sold  at pharmacies and stores.  Drink enough fluid to keep your pee (urine) pale yellow.  Drink clear fluids in small amounts as you are able. Clear fluids include: ? Water. ? Ice chips. ? Fruit juice that has water added (diluted fruit juice). ? Low-calorie sports drinks.  Eat bland, easy-to-digest foods in small amounts as you are able. These foods include: ? Bananas. ? Applesauce. ? Rice. ? Lean meats. ? Toast. ? Crackers.  Do not eat or drink: ? Fluids that have a lot of sugar or  caffeine. ? Alcohol. ? Spicy or fatty foods. General instructions  Take over-the-counter and prescription medicines only as told by your doctor.  Use a cool mist humidifier to add moisture to the air in your home. This can make it easier for you to breathe.  Cover your mouth and nose when you cough or sneeze.  Wash your hands with soap and water often, especially after you cough or sneeze. If you cannot use soap and water, use alcohol-based hand sanitizer.  Keep all follow-up visits as told by your doctor. This is important. How is this prevented?   Get a flu shot every year. You may get the flu shot in late summer, fall, or winter. Ask your doctor when you should get your flu shot.  Avoid contact with people who are sick during fall and winter (cold and flu season). Contact a doctor if:  You get new symptoms.  You have: ? Chest pain. ? Watery poop (diarrhea). ? A fever.  Your cough gets worse.  You start to have more mucus.  You feel sick to your stomach.  You throw up. Get help right away if you:  Have shortness of breath.  Have trouble breathing.  Have skin or nails that turn a bluish color.  Have very bad pain or stiffness in your neck.  Get a sudden headache.  Get sudden pain in your face or ear.  Cannot eat or drink without throwing up. Summary  Influenza ("the flu") is an infection in the lungs, nose, and throat. It is caused by a virus.  Take over-the-counter and prescription medicines only as told by your doctor.  Getting a flu shot every year is the best way to avoid getting the flu. This information is not intended to replace advice given to you by your health care provider. Make sure you discuss any questions you have with your health care provider. Document Released: 07/15/2008 Document Revised: 03/24/2018 Document Reviewed: 03/24/2018 Elsevier Interactive Patient Education  2019 [employer].

## 2018-10-25 ENCOUNTER — Telehealth: Payer: Self-pay | Admitting: Emergency Medicine

## 2018-10-25 NOTE — Telephone Encounter (Signed)
Spoke with patien whom stated that she is doing a lot better. Follow up call from visit with [facility]

## 2020-07-31 ENCOUNTER — Other Ambulatory Visit: Payer: Self-pay

## 2020-07-31 ENCOUNTER — Emergency Department
Admission: EM | Admit: 2020-07-31 | Discharge: 2020-07-31 | Disposition: A | Discharge status: Home / Self Care | Payer: PRIVATE HEALTH INSURANCE | Attending: Emergency Medicine | Admitting: Emergency Medicine

## 2020-07-31 ENCOUNTER — Emergency Department: Payer: PRIVATE HEALTH INSURANCE

## 2020-07-31 ENCOUNTER — Ambulatory Visit
Admission: RE | Admit: 2020-07-31 | Discharge: 2020-07-31 | Disposition: A | Discharge status: Home / Self Care | Payer: PRIVATE HEALTH INSURANCE | Source: Ambulatory Visit | Attending: Emergency Medicine | Admitting: Emergency Medicine

## 2020-07-31 VITALS — BP 114/78 | HR 93 | Temp 98.4°F | Resp 20

## 2020-07-31 DIAGNOSIS — F17210 Nicotine dependence, cigarettes, uncomplicated: Secondary | ICD-10-CM | POA: Diagnosis not present

## 2020-07-31 DIAGNOSIS — N10 Acute tubulo-interstitial nephritis: Secondary | ICD-10-CM | POA: Diagnosis not present

## 2020-07-31 DIAGNOSIS — N946 Dysmenorrhea, unspecified: Secondary | ICD-10-CM | POA: Diagnosis present

## 2020-07-31 DIAGNOSIS — R102 Pelvic and perineal pain: Secondary | ICD-10-CM | POA: Insufficient documentation

## 2020-07-31 DIAGNOSIS — R309 Painful micturition, unspecified: Secondary | ICD-10-CM | POA: Insufficient documentation

## 2020-07-31 DIAGNOSIS — R1031 Right lower quadrant pain: Secondary | ICD-10-CM

## 2020-07-31 DIAGNOSIS — R1030 Lower abdominal pain, unspecified: Secondary | ICD-10-CM | POA: Diagnosis present

## 2020-07-31 LAB — POCT URINALYSIS DIP (MANUAL ENTRY)
Bilirubin, UA: NEGATIVE
Glucose, UA: 100 mg/dL — AB
Nitrite, UA: POSITIVE — AB
Protein Ur, POC: >=300 mg/dL — AB
Spec Grav, UA: 1.02 (ref 1.010–1.025)
Urobilinogen, UA: 2 E.U./dL — AB
pH, UA: 5 (ref 5.0–8.0)

## 2020-07-31 LAB — COMPREHENSIVE METABOLIC PANEL
ALT: 32 U/L (ref 0–44)
AST: 29 U/L (ref 15–41)
Albumin: 4.4 g/dL (ref 3.5–5.0)
Alkaline Phosphatase: 72 U/L (ref 38–126)
Anion gap: 11 (ref 5–15)
BUN: 9 mg/dL (ref 6–20)
CO2: 25 mmol/L (ref 22–32)
Calcium: 9.1 mg/dL (ref 8.9–10.3)
Chloride: 104 mmol/L (ref 98–111)
Creatinine, Ser: 0.77 mg/dL (ref 0.44–1.00)
GFR, Estimated: >60 mL/min (ref >60)
Glucose, Bld: 104 mg/dL — ABNORMAL HIGH (ref 70–99)
Potassium: 4.1 mmol/L (ref 3.5–5.1)
Sodium: 140 mmol/L (ref 135–145)
Total Bilirubin: 0.9 mg/dL (ref 0.3–1.2)
Total Protein: 7.7 g/dL (ref 6.5–8.1)

## 2020-07-31 LAB — URINALYSIS, COMPLETE (UACMP) WITH MICROSCOPIC
Bilirubin Urine: NEGATIVE
Glucose, UA: NEGATIVE mg/dL
Ketones, ur: NEGATIVE mg/dL
Nitrite: POSITIVE — AB
Protein, ur: >=300 mg/dL — AB
RBC / HPF: >50 RBC/hpf — ABNORMAL HIGH (ref 0–5)
Specific Gravity, Urine: 1.015 (ref 1.005–1.030)
WBC, UA: >50 WBC/hpf — ABNORMAL HIGH (ref 0–5)
pH: 7 (ref 5.0–8.0)

## 2020-07-31 LAB — POCT URINE PREGNANCY: Preg Test, Ur: NEGATIVE

## 2020-07-31 LAB — SAMPLE TO BLOOD BANK

## 2020-07-31 LAB — CBC
HCT: 37.7 % (ref 36.0–46.0)
Hemoglobin: 12.5 g/dL (ref 12.0–15.0)
MCH: 27.9 pg (ref 26.0–34.0)
MCHC: 33.2 g/dL (ref 30.0–36.0)
MCV: 84.2 fL (ref 80.0–100.0)
Platelets: 315 10*3/uL (ref 150–400)
RBC: 4.48 MIL/uL (ref 3.87–5.11)
RDW: 13.9 % (ref 11.5–15.5)
WBC: 14.9 10*3/uL — ABNORMAL HIGH (ref 4.0–10.5)
nRBC: 0 % (ref 0.0–0.2)

## 2020-07-31 LAB — LIPASE, BLOOD: Lipase: 22 U/L (ref 11–51)

## 2020-07-31 LAB — POCT PREGNANCY, URINE: Preg Test, Ur: NEGATIVE

## 2020-07-31 MED ORDER — PHENAZOPYRIDINE HCL 200 MG PO TABS: 200.0000 mg | ORAL_TABLET | Freq: Three times a day (TID) | ORAL | 0 refills | Status: DC | PRN

## 2020-07-31 MED ORDER — CEFTRIAXONE SODIUM 1 G IJ SOLR
1.0000 g | Freq: Once | INTRAMUSCULAR | Status: AC
  Administered 2020-07-31: 1 g via INTRAMUSCULAR
  Filled 2020-07-31: qty 10

## 2020-07-31 MED ORDER — ONDANSETRON 4 MG PO TBDP: 4.0000 mg | ORAL_TABLET | Freq: Three times a day (TID) | ORAL | 0 refills | Status: DC | PRN

## 2020-07-31 MED ORDER — LIDOCAINE HCL (PF) 1 % IJ SOLN
2.1000 mL | Freq: Once | INTRAMUSCULAR | Status: AC
  Administered 2020-07-31: 2.1 mL
  Filled 2020-07-31: qty 5

## 2020-07-31 MED ORDER — LEVOFLOXACIN 250 MG PO TABS: 250.0000 mg | ORAL_TABLET | Freq: Every day | ORAL | 0 refills | Status: AC

## 2020-07-31 NOTE — ED Triage Notes (Signed)
Pt presents with c/o vaginal pain and pressure  And states that she has been passing clots for past few days, also having some urinary discomfort after urination and frequency , has IUD

## 2020-07-31 NOTE — ED Triage Notes (Signed)
PT to ED via POV c/o vaginal pain and bleeding x5 days. PT has been passing clots. Severe pain to RLQ and back. PT also states painful urination, not burning but like labor pains as well as frequent urination. PT appears uncomfortable.

## 2020-07-31 NOTE — Discharge Instructions (Signed)
Please follow up with primary care in about a week.  Return to the ER for symptoms that change or worsen or for new concerns if unable to schedule an appointment.

## 2020-07-31 NOTE — Discharge Instructions (Signed)
Recommending further evaluation and management in the ED for abdominal pain, abnormal vaginal bleeding and painful urination.  Cannot rule out appendicitis, ovarian torsion, ectopic pregnancy, pyelonephritis, etc... in urgent care setting.  Patient aware and in agreement with plan.  Will go by private vehicle to ED.

## 2020-07-31 NOTE — ED Provider Notes (Signed)
[facility]   [phone] 07/31/20 Arrival Time: 1150  CC: ABDOMINAL DISCOMFORT  SUBJECTIVE:  Nichole Winters is a 29 y.o. female who presents with complaint of abnormal vaginal bleeding, lower abdominal pain, and painful urination x 5 days.  Denies a precipitating event, trauma, changes in diet.  Localizes pain to lower abdomen.  Describes as intermittent and sharp in character.  Reports "contraction" like pain with urination.  States pain currently is 6-7, but gets as high as a 10.  Has tried OTC AZO without relief.  Worse with walking.  Denies similar symptoms in the past.  Complains of associated urinary frequency, and urgency.    Denies fever, chills, vomiting, chest pain, SOB, diarrhea, constipation, hematochezia, melena, flank pain, loss of bowel or bladder function, vaginal discharge, vaginal odor, dyspareunia, pelvic pain.     Had STD testing last week that was negative.    No LMP recorded. (Menstrual status: IUD).  ROS: As per HPI.  All other pertinent ROS negative.     Past Medical History:  Diagnosis Date  . Anxiety 12/23/2016  . Breast mass 2015   right breast  . Genital herpes 12/23/2016  . Major depression 12/23/2016  . Migraine    Past Surgical History:  Procedure Laterality Date  . CHOLECYSTECTOMY     Allergies  Allergen Reactions  . Oat Anaphylaxis   No current facility-administered medications on file prior to encounter.   Current Outpatient Medications on File Prior to Encounter  Medication Sig Dispense Refill  . fluticasone (FLONASE) 50 MCG/ACT nasal spray Place 2 sprays into both nostrils daily. 16 g 0  . [DISCONTINUED] levonorgestrel (MIRENA) 20 MCG/24HR IUD 1 each by Intrauterine route once.     Social History   Socioeconomic History  . Marital status: Single    Spouse name: Not on file  . Number of children: Not on file  . Years of education: Not on file  . Highest education level: Not on file  Occupational History  . Not on file    Tobacco Use  . Smoking status: Current Every Day Smoker    Packs/day: 0.25    Years: 1.00    Pack years: 0.25    Types: Cigarettes  . Smokeless tobacco: Never Used  Vaping Use  . Vaping Use: Never used  Substance and Sexual Activity  . Alcohol use: Yes    Comment: social  . Drug use: Never  . Sexual activity: Yes    Birth control/protection: I.U.D.  Other Topics Concern  . Not on file  Social History Narrative  . Not on file   Social Determinants of Health   Financial Resource Strain:   . Difficulty of Paying Living Expenses: Not on file  Food Insecurity:   . Worried About Programme researcher, broadcasting/film/video in the Last Year: Not on file  . Ran Out of Food in the Last Year: Not on file  Transportation Needs:   . Lack of Transportation (Medical): Not on file  . Lack of Transportation (Non-Medical): Not on file  Physical Activity:   . Days of Exercise per Week: Not on file  . Minutes of Exercise per Session: Not on file  Stress:   . Feeling of Stress : Not on file  Social Connections:   . Frequency of Communication with Friends and Family: Not on file  . Frequency of Social Gatherings with Friends and Family: Not on file  . Attends Religious Services: Not on file  . Active Member of Clubs or  Organizations: Not on file  . Attends Banker Meetings: Not on file  . Marital Status: Not on file  Intimate Partner Violence:   . Fear of Current or Ex-Partner: Not on file  . Emotionally Abused: Not on file  . Physically Abused: Not on file  . Sexually Abused: Not on file   Family History  Problem Relation Age of Onset  . Ovarian cancer Paternal Grandmother 82  . Hypertension Paternal Grandmother   . CVA Paternal Grandmother   . Heart attack Paternal Grandmother   . Paranoid behavior Mother   . Schizophrenia Mother      OBJECTIVE:  Vitals:   07/31/20 1222  BP: 114/78  Pulse: 93  Resp: 20  Temp: 98.4 F (36.9 C)  SpO2: 96%    General appearance: Alert; appears  uncomfortable HEENT: NCAT.  Oropharynx clear.  Lungs: clear to auscultation bilaterally without adventitious breath sounds Heart: regular rate and rhythm.   Abdomen: soft, non-distended; normal active bowel sounds; TTP over RLQ and suprapubic region; tender at McBurney's point; negative Murphy's sign; no guarding Back: + LT sided CVA tenderness Extremities: no edema; symmetrical with no gross deformities Skin: warm and dry Neurologic: normal gait Psychological: alert and cooperative; normal mood and affect  LABS: Results for orders placed or performed during the hospital encounter of 07/31/20 (from the past 24 hour(s))  POCT urinalysis dipstick     Status: Abnormal   Collection Time: 07/31/20 12:30 PM  Result Value Ref Range   Color, UA orange (A) yellow   Clarity, UA cloudy (A) clear   Glucose, UA =100 (A) negative mg/dL   Bilirubin, UA negative negative   Ketones, POC UA trace (5) (A) negative mg/dL   Spec Grav, UA 4.008 6.761 - 1.025   Blood, UA large (A) negative   pH, UA 5.0 5.0 - 8.0   Protein Ur, POC >=300 (A) negative mg/dL   Urobilinogen, UA 2.0 (A) 0.2 or 1.0 E.U./dL   Nitrite, UA Positive (A) Negative   Leukocytes, UA Large (3+) (A) Negative  POCT urine pregnancy     Status: None   Collection Time: 07/31/20 12:30 PM  Result Value Ref Range   Preg Test, Ur Negative Negative    ASSESSMENT & PLAN:  1. RLQ abdominal pain   2. Suprapubic pain   3. Dysmenorrhea   4. Painful urination     Recommending further evaluation and management in the ED for abdominal pain, abnormal vaginal bleeding and painful urination.  Cannot rule out appendicitis, ovarian torsion, ectopic pregnancy, pyelonephritis, etc... in urgent care setting.  Patient aware and in agreement with plan.  Will go by private vehicle to ED.     Rennis Harding, PA-C 07/31/20 1253

## 2020-07-31 NOTE — ED Provider Notes (Signed)
[facility] Emergency Department Provider Note ____________________________________________   First MD Initiated Contact with Patient 07/31/20 1938     (approximate)  I have reviewed the triage vital signs and the nursing notes.   HISTORY  Chief Complaint Abdominal Pain and Vaginal Bleeding  HPI Nichole Winters is a 29 y.o. female presents to the emergency department for treatment and evaluation of right flank pain, suprapubic pain, and hematuria. Symptoms started about 5 days ago. No fever. Recent STD testing was negative. Menstrual cycle is heavier than usual as well.         Past Medical History:  Diagnosis Date  . Anxiety 12/23/2016  . Breast mass 2015   right breast  . Genital herpes 12/23/2016  . Major depression 12/23/2016  . Migraine     Patient Active Problem List   Diagnosis Date Noted  . Anxiety 12/23/2016  . Major depression 12/23/2016  . Genital herpes 12/23/2016  . Encounter for IUD insertion 02/14/2014    Past Surgical History:  Procedure Laterality Date  . CHOLECYSTECTOMY      Prior to Admission medications   Medication Sig Start Date End Date Taking? Authorizing Provider  fluticasone (FLONASE) 50 MCG/ACT nasal spray Place 2 sprays into both nostrils daily. 10/21/18   Janalyn Harder, PA-C  levofloxacin (LEVAQUIN) 250 MG tablet Take 1 tablet (250 mg total) by mouth daily for 7 days. 07/31/20 08/07/20  Brandii Lakey, Rulon Eisenmenger B, FNP  ondansetron (ZOFRAN-ODT) 4 MG disintegrating tablet Take 1 tablet (4 mg total) by mouth every 8 (eight) hours as needed for nausea or vomiting. 07/31/20   Landen Breeland, Rulon Eisenmenger B, FNP  phenazopyridine (PYRIDIUM) 200 MG tablet Take 1 tablet (200 mg total) by mouth 3 (three) times daily as needed for pain. 07/31/20   Corlette Ciano, Rulon Eisenmenger B, FNP  levonorgestrel (MIRENA) 20 MCG/24HR IUD 1 each by Intrauterine route once.  07/31/20  [provider]    Allergies Oat  Family History  Problem Relation Age of Onset    . Ovarian cancer Paternal Grandmother 24  . Hypertension Paternal Grandmother   . CVA Paternal Grandmother   . Heart attack Paternal Grandmother   . Paranoid behavior Mother   . Schizophrenia Mother     Social History Social History   Tobacco Use  . Smoking status: Current Every Day Smoker    Packs/day: 0.25    Years: 1.00    Pack years: 0.25    Types: Cigarettes  . Smokeless tobacco: Never Used  Vaping Use  . Vaping Use: Never used  Substance Use Topics  . Alcohol use: Yes    Comment: social  . Drug use: Never    Review of Systems  Constitutional: No fever/chills Eyes: No visual changes. ENT: No sore throat. Cardiovascular: Denies chest pain. Respiratory: Denies shortness of breath. Gastrointestinal: Positive for abdominal pain.  No nausea, no vomiting.  No diarrhea.  No constipation. Genitourinary: Negative for dysuria. Musculoskeletal:Positive for back pain. Skin: Negative for rash. Neurological: Negative for headaches, focal weakness or numbness. ____________________________________________   PHYSICAL EXAM:  VITAL SIGNS: ED Triage Vitals  Enc Vitals Group     BP 07/31/20 1734 (!) 177/77     Pulse Rate 07/31/20 1734 91     Resp 07/31/20 1734 20     Temp 07/31/20 1734 99.3 F (37.4 C)     Temp Source 07/31/20 1734 Oral     SpO2 07/31/20 1734 98 %     Weight 07/31/20 1737 176 lb (79.8 kg)  Height 07/31/20 1737 5\' 7"  (1.702 m)     Head Circumference --      Peak Flow --      Pain Score 07/31/20 1736 8     Pain Loc --      Pain Edu? --      Excl. in GC? --     Constitutional: Alert and oriented. Well appearing and in no acute distress. Eyes: Conjunctivae are normal. Head: Atraumatic. Nose: No congestion/rhinnorhea. Mouth/Throat: Mucous membranes are moist.  Oropharynx non-erythematous. Neck: No stridor.   Hematological/Lymphatic/Immunilogical: No cervical lymphadenopathy. Cardiovascular: Normal rate, regular rhythm.  Good peripheral  circulation. Respiratory: Normal respiratory effort.  No retractions.  Gastrointestinal: Soft. Suprapubic tenderness. No distention. No abdominal bruits. Right CVA tenderness. Musculoskeletal: No lower extremity tenderness nor edema.  No joint effusions. Neurologic:  Normal speech and language. No gross focal neurologic deficits are appreciated. No gait instability. Skin:  Skin is warm, dry and intact. No rash noted. Psychiatric: Mood and affect are normal. Speech and behavior are normal.  ____________________________________________   LABS (all labs ordered are listed, but only abnormal results are displayed)  Labs Reviewed  COMPREHENSIVE METABOLIC PANEL - Abnormal; Notable for the following components:      Result Value   Glucose, Bld 104 (*)    All other components within normal limits  CBC - Abnormal; Notable for the following components:   WBC 14.9 (*)    All other components within normal limits  URINALYSIS, COMPLETE (UACMP) WITH MICROSCOPIC - Abnormal; Notable for the following components:   Color, Urine AMBER (*)    APPearance CLOUDY (*)    Hgb urine dipstick LARGE (*)    Protein, ur >=300 (*)    Nitrite POSITIVE (*)    Leukocytes,Ua MODERATE (*)    RBC / HPF >50 (*)    WBC, UA >50 (*)    Bacteria, UA RARE (*)    All other components within normal limits  LIPASE, BLOOD  POC URINE PREG, ED  POCT PREGNANCY, URINE  SAMPLE TO BLOOD BANK   ____________________________________________  EKG  Not indicated. ____________________________________________  RADIOLOGY  ED MD interpretation:    CT shows bladder wall thickening and stranding surrounding the right ureter.  Official radiology report(s): CT Renal Stone Study  Result Date: 07/31/2020 CLINICAL DATA:  Hematuria. Vaginal pain and bleeding for 5 days. Painful urination with increased frequency. EXAM: CT ABDOMEN AND PELVIS WITHOUT CONTRAST TECHNIQUE: Multidetector CT imaging of the abdomen and pelvis was  performed following the standard protocol without IV contrast. COMPARISON:  None. FINDINGS: Lower chest: No acute abnormality. Hepatobiliary: No focal liver abnormality is seen. Status post cholecystectomy. There are clips within the gallbladder fossa. Additionally, there is a clip in the subhepatic space which likely represents a dropped clip. No biliary dilation. Pancreas: Unremarkable. No pancreatic ductal dilatation or surrounding inflammatory changes. Spleen: Normal in size without focal abnormality. Adrenals/Urinary Tract: Normal adrenal glands. There is mild stranding surrounding the right ureter throughout its course. No radiodense renal or ureteric calculi identified. A calcification in the right anatomic pelvis on series 2, image 77 is favored to be posterior to the right ureter and represent a phlebolith. No hydronephrosis. There is circumferential bladder wall thickening. Stomach/Bowel: Stomach is within normal limits. Appendix appears normal. No evidence of bowel wall thickening, distention, or inflammatory changes. Vascular/Lymphatic: No significant vascular findings are present. No enlarged abdominal or pelvic lymph nodes. Reproductive: Uterus and bilateral adnexa are unremarkable. IUD in place. Other: No abdominal wall hernia or  abnormality. Small amount of free fluid in the anatomic pelvis. Musculoskeletal: No acute osseous abnormality. IMPRESSION: Circumferential bladder wall thickening with mild stranding surrounding the right ureter throughout its course. Evaluation is limited in the absence of IV contrast; however, these findings can be seen in the setting of a urinary tract infection with ascending infection. Recommend correlation with urinalysis. No radiodense renal or ureteric calculi identified. No hydronephrosis. Electronically Signed   By: Feliberto Harts MD   On: 07/31/2020 18:39    ____________________________________________   PROCEDURES  Procedure(s) performed (including  Critical Care):  Procedures  ____________________________________________   INITIAL IMPRESSION / ASSESSMENT AND PLAN     29 year old female presenting to the emergency department for treatment and evaluation of right flank and suprapubic pain. See HPI for further details. While awaiting ER room assignment, labs and CT were obtained. Plan will be to review results which will determine plan of care.  DIFFERENTIAL DIAGNOSIS  Acute cystitis, kidney stone, pyelonephritis, PID  ED COURSE  Urinalysis shows nitrate positive with greater than 50 white blood cells, moderate number of leukocytes, large amount of hemoglobin, rare bacteria, white blood cell clumps and mucus. Plan will be to give her 1 g of Rocephin IM while here and prescribed Levaquin for the next 7 days. She will also receive a prescription for Pyridium and Zofran.  She is to follow up with primary care in about a week. For symptoms of concern she is to return to the ER if unable to schedule an appointment.    ___________________________________________   FINAL CLINICAL IMPRESSION(S) / ED DIAGNOSES  Final diagnoses:  Pyelonephritis, acute     ED Discharge Orders         Ordered    levofloxacin (LEVAQUIN) 250 MG tablet  Daily        07/31/20 1956    phenazopyridine (PYRIDIUM) 200 MG tablet  3 times daily PRN        07/31/20 1956    ondansetron (ZOFRAN-ODT) 4 MG disintegrating tablet  Every 8 hours PRN        07/31/20 1956           Nichole Winters was evaluated in Emergency Department on 07/31/2020 for the symptoms described in the history of present illness. She was evaluated in the context of the global COVID-19 pandemic, which necessitated consideration that the patient might be at risk for infection with the SARS-CoV-2 virus that causes COVID-19. Institutional protocols and algorithms that pertain to the evaluation of patients at risk for COVID-19 are in a state of rapid change based on information released  by regulatory bodies including the CDC and federal and state organizations. These policies and algorithms were followed during the patient's care in the ED.   Note:  This document was prepared using Dragon voice recognition software and may include unintentional dictation errors.   Chinita Pester, FNP 07/31/20 2009    Chesley Noon, MD 07/31/20 2119

## 2020-08-01 LAB — URINE CULTURE

## 2020-08-02 LAB — URINE CULTURE: Culture: >=100000 — AB

## 2020-08-29 ENCOUNTER — Encounter: Payer: Self-pay | Admitting: Advanced Practice Midwife

## 2020-08-29 ENCOUNTER — Ambulatory Visit (INDEPENDENT_AMBULATORY_CARE_PROVIDER_SITE_OTHER): Payer: PRIVATE HEALTH INSURANCE | Admitting: Advanced Practice Midwife

## 2020-08-29 ENCOUNTER — Other Ambulatory Visit: Payer: Self-pay

## 2020-08-29 ENCOUNTER — Other Ambulatory Visit: Payer: Self-pay | Admitting: Advanced Practice Midwife

## 2020-08-29 VITALS — BP 121/81 | HR 81 | Wt 172.0 lb

## 2020-08-29 DIAGNOSIS — N938 Other specified abnormal uterine and vaginal bleeding: Secondary | ICD-10-CM

## 2020-08-29 DIAGNOSIS — N926 Irregular menstruation, unspecified: Secondary | ICD-10-CM

## 2020-08-29 LAB — POCT URINE PREGNANCY: Preg Test, Ur: NEGATIVE

## 2020-08-29 NOTE — Progress Notes (Signed)
   GYN VISIT Patient name: Nichole Winters MRN [id number]  Date of birth: 11/28/1990 Chief Complaint:   No chief complaint on file.  History of Present Illness:   Nichole Winters is a 29 y.o. G8P2002 Caucasian female being seen today for hx of home +/equivocal UPTs starting 10d ago. She went to the [facility] on 11/3 and had her IUD removed (placed in 2020) because it was 'sideways'. This week she has had irreg bldg, heavy with clots at times. She denies dizziness, but feels exhausted and nauseous with minimal appetite. She is concerned that she may be pregnant as with her daughter, born in 26, it was hard to tell if she was pregnant at the beginning due to conflicting bloodwork.  She does desire a pregnancy, so she isn't interested in restarting contraception if she isn't pregnant now.       Depression screen PHQ 2/9 08/29/2020  Decreased Interest 0  Down, Depressed, Hopeless 0  PHQ - 2 Score 0  Altered sleeping 2  Tired, decreased energy 2  Change in appetite 3  Feeling bad or failure about yourself  0  Trouble concentrating 0  Moving slowly or fidgety/restless 0  Suicidal thoughts 0  PHQ-9 Score 7    Patient's last menstrual period was 06/27/2020 (approximate). The current method of family planning is none.  Last pap 2020. Results were:  normal, per pt at [facility] Review of Systems:   Pertinent items are noted in HPI Denies fever/chills, dizziness, headaches, visual disturbances, fatigue, shortness of breath, chest pain, abdominal pain, vomiting, abnormal vaginal discharge/itching/odor/irritation, problems with periods, bowel movements, urination, or intercourse unless otherwise stated above.  Pertinent History Reviewed:  Reviewed past medical,surgical, social, obstetrical and family history.  Reviewed problem list, medications and allergies. Physical Assessment:   Vitals:   08/29/20 1146  BP: 121/81  Pulse: 81  Weight: 172 lb (78 kg)  Body mass index is 26.94  kg/m.       Physical Examination:   General appearance: alert, well appearing, and in no distress  Mental status: alert, oriented to person, place, and time  Skin: warm & dry   Cardiovascular: normal heart rate noted  Respiratory: normal respiratory effort, no distress  Abdomen: soft, non-tender   Pelvic: examination not indicated  Extremities: no edema    Results for orders placed or performed in visit on 08/29/20 (from the past 24 hour(s))  POCT urine pregnancy   Collection Time: 08/29/20 11:49 AM  Result Value Ref Range   Preg Test, Ur Negative Negative    Assessment & Plan:  1) DUB> equivocal UPTs, will get bHcg per pt request; also get CBC to eval for anemia  2) Desires conception> take PNV qd and call with +UPT  Meds: No orders of the defined types were placed in this encounter.   Orders Placed This Encounter  Procedures  . B-HCG Quant  . CBC  . POCT urine pregnancy    Return for prn.  Arabella Merles CNM 08/29/2020 12:16 PM

## 2020-08-30 ENCOUNTER — Telehealth: Payer: Self-pay | Admitting: *Deleted

## 2020-08-30 LAB — CBC
Hematocrit: 39.2 % (ref 34.0–46.6)
Hemoglobin: 12.6 g/dL (ref 11.1–15.9)
MCH: 27.3 pg (ref 26.6–33.0)
MCHC: 32.1 g/dL (ref 31.5–35.7)
MCV: 85 fL (ref 79–97)
Platelets: 315 10*3/uL (ref 150–450)
RBC: 4.61 x10E6/uL (ref 3.77–5.28)
RDW: 13.1 % (ref 11.7–15.4)
WBC: 7.8 10*3/uL (ref 3.4–10.8)

## 2020-08-30 LAB — BETA HCG QUANT (REF LAB): hCG Quant: <1 m[IU]/mL

## 2020-08-30 NOTE — Telephone Encounter (Signed)
LMOVM requesting patient return my call regarding her test results.

## 2020-08-30 NOTE — Telephone Encounter (Signed)
Patient informed serum preg test was negative and her iron level is normal at 12.6.  Pt verbalized understanding with no further questions.

## 2020-11-05 ENCOUNTER — Emergency Department (HOSPITAL_COMMUNITY)
Admission: EM | Admit: 2020-11-05 | Discharge: 2020-11-05 | Disposition: A | Discharge status: Home / Self Care | Payer: PRIVATE HEALTH INSURANCE | Attending: Emergency Medicine | Admitting: Emergency Medicine

## 2020-11-05 ENCOUNTER — Encounter (HOSPITAL_COMMUNITY): Payer: Self-pay | Admitting: Emergency Medicine

## 2020-11-05 ENCOUNTER — Other Ambulatory Visit: Payer: Self-pay

## 2020-11-05 DIAGNOSIS — F17210 Nicotine dependence, cigarettes, uncomplicated: Secondary | ICD-10-CM | POA: Diagnosis not present

## 2020-11-05 DIAGNOSIS — R42 Dizziness and giddiness: Secondary | ICD-10-CM | POA: Insufficient documentation

## 2020-11-05 DIAGNOSIS — U071 COVID-19: Secondary | ICD-10-CM | POA: Insufficient documentation

## 2020-11-05 DIAGNOSIS — R112 Nausea with vomiting, unspecified: Secondary | ICD-10-CM

## 2020-11-05 LAB — CBC WITH DIFFERENTIAL/PLATELET
Abs Immature Granulocytes: 0.01 10*3/uL (ref 0.00–0.07)
Basophils Absolute: 0 10*3/uL (ref 0.0–0.1)
Basophils Relative: 1 %
Eosinophils Absolute: 0.1 10*3/uL (ref 0.0–0.5)
Eosinophils Relative: 1 %
HCT: 38.9 % (ref 36.0–46.0)
Hemoglobin: 12.5 g/dL (ref 12.0–15.0)
Immature Granulocytes: 0 %
Lymphocytes Relative: 9 %
Lymphs Abs: 0.4 10*3/uL — ABNORMAL LOW (ref 0.7–4.0)
MCH: 27.4 pg (ref 26.0–34.0)
MCHC: 32.1 g/dL (ref 30.0–36.0)
MCV: 85.1 fL (ref 80.0–100.0)
Monocytes Absolute: 0.4 10*3/uL (ref 0.1–1.0)
Monocytes Relative: 10 %
Neutro Abs: 3.4 10*3/uL (ref 1.7–7.7)
Neutrophils Relative %: 79 %
Platelets: 217 10*3/uL (ref 150–400)
RBC: 4.57 MIL/uL (ref 3.87–5.11)
RDW: 14 % (ref 11.5–15.5)
WBC: 4.3 10*3/uL (ref 4.0–10.5)
nRBC: 0 % (ref 0.0–0.2)

## 2020-11-05 LAB — URINALYSIS, ROUTINE W REFLEX MICROSCOPIC
Bilirubin Urine: NEGATIVE
Glucose, UA: NEGATIVE mg/dL
Hgb urine dipstick: NEGATIVE
Ketones, ur: 20 mg/dL — AB
Leukocytes,Ua: NEGATIVE
Nitrite: NEGATIVE
Protein, ur: 30 mg/dL — AB
Specific Gravity, Urine: 1.032 — ABNORMAL HIGH (ref 1.005–1.030)
pH: 5 (ref 5.0–8.0)

## 2020-11-05 LAB — COMPREHENSIVE METABOLIC PANEL
ALT: 42 U/L (ref 0–44)
AST: 47 U/L — ABNORMAL HIGH (ref 15–41)
Albumin: 4.3 g/dL (ref 3.5–5.0)
Alkaline Phosphatase: 60 U/L (ref 38–126)
Anion gap: 8 (ref 5–15)
BUN: 10 mg/dL (ref 6–20)
CO2: 22 mmol/L (ref 22–32)
Calcium: 9.2 mg/dL (ref 8.9–10.3)
Chloride: 105 mmol/L (ref 98–111)
Creatinine, Ser: 0.53 mg/dL (ref 0.44–1.00)
GFR, Estimated: >60 mL/min (ref >60)
Glucose, Bld: 107 mg/dL — ABNORMAL HIGH (ref 70–99)
Potassium: 3.3 mmol/L — ABNORMAL LOW (ref 3.5–5.1)
Sodium: 135 mmol/L (ref 135–145)
Total Bilirubin: 0.7 mg/dL (ref 0.3–1.2)
Total Protein: 7.5 g/dL (ref 6.5–8.1)

## 2020-11-05 LAB — PREGNANCY, URINE: Preg Test, Ur: NEGATIVE

## 2020-11-05 LAB — LIPASE, BLOOD: Lipase: 19 U/L (ref 11–51)

## 2020-11-05 LAB — SARS CORONAVIRUS 2 (TAT 6-24 HRS): SARS Coronavirus 2: POSITIVE — AB

## 2020-11-05 MED ORDER — ONDANSETRON HCL 4 MG/2ML IJ SOLN
4.0000 mg | Freq: Once | INTRAMUSCULAR | Status: AC
  Administered 2020-11-05: 4 mg via INTRAVENOUS
  Filled 2020-11-05: qty 2

## 2020-11-05 MED ORDER — SODIUM CHLORIDE 0.9 % IV BOLUS: 1000.0000 mL | Freq: Once | INTRAVENOUS | Status: DC

## 2020-11-05 MED ORDER — KETOROLAC TROMETHAMINE 30 MG/ML IJ SOLN
30.0000 mg | Freq: Once | INTRAMUSCULAR | Status: AC
  Administered 2020-11-05: 30 mg via INTRAVENOUS
  Filled 2020-11-05: qty 1

## 2020-11-05 MED ORDER — PROMETHAZINE HCL 25 MG/ML IJ SOLN
12.5000 mg | Freq: Once | INTRAMUSCULAR | Status: DC
  Filled 2020-11-05: qty 1

## 2020-11-05 MED ORDER — SODIUM CHLORIDE 0.9 % IV BOLUS
1000.0000 mL | Freq: Once | INTRAVENOUS | Status: AC
  Administered 2020-11-05: 1000 mL via INTRAVENOUS

## 2020-11-05 NOTE — ED Provider Notes (Signed)
[facility] EMERGENCY DEPARTMENT Provider Note   CSN: [id number] Arrival date & time: 11/05/20  0055     History Chief Complaint  Patient presents with  . Emesis    Nichole Winters is a 30 y.o. female.  Patient is a 30 year old female with history of anxiety, migraines.  She presents today for evaluation of nausea and vomiting.  Patient states that approximately noon today, she began with the sudden onset of nausea and vomiting.  She has thrown up multiple times and feels dizzy and lightheaded.  She denies any diarrhea or bloody stools.  She denies any ill contacts.  She tells me her temperature was 102 prior to coming here, but is afebrile here in the ER.  She reports having had received her COVID-vaccine.  The history is provided by the patient.  Emesis Severity:  Moderate Duration:  12 hours Timing:  Constant Quality:  Stomach contents Progression:  Worsening Chronicity:  New Recent urination:  Decreased Relieved by:  Nothing Worsened by:  Nothing Ineffective treatments:  None tried Associated symptoms: fever   Associated symptoms: no diarrhea        Past Medical History:  Diagnosis Date  . Anxiety 12/23/2016  . Breast mass 2015   right breast  . Genital herpes 12/23/2016  . Major depression 12/23/2016  . Migraine     Patient Active Problem List   Diagnosis Date Noted  . Anxiety 12/23/2016  . Major depression 12/23/2016  . Genital herpes 12/23/2016    Past Surgical History:  Procedure Laterality Date  . CHOLECYSTECTOMY       OB History    Gravida  2   Para  2   Term  1   Preterm      AB      Living  2     SAB      IAB      Ectopic      Multiple      Live Births  2        Obstetric Comments  1st Menstrual Cycle:  13 1st Pregnancy:  28        Family History  Problem Relation Age of Onset  . Ovarian cancer Paternal Grandmother 40  . Hypertension Paternal Grandmother   . CVA Paternal Grandmother   . Heart attack Paternal  Grandmother   . Paranoid behavior Mother   . Schizophrenia Mother     Social History   Tobacco Use  . Smoking status: Current Every Day Smoker    Packs/day: 0.25    Years: 1.00    Pack years: 0.25    Types: Cigarettes  . Smokeless tobacco: Never Used  Vaping Use  . Vaping Use: Never used  Substance Use Topics  . Alcohol use: Yes    Comment: social  . Drug use: Never    Home Medications Prior to Admission medications   Medication Sig Start Date End Date Taking? Authorizing Provider  Multiple Vitamin (MULTIVITAMIN) tablet Take 1 tablet by mouth daily.    [provider]  levonorgestrel (MIRENA) 20 MCG/24HR IUD 1 each by Intrauterine route once.  07/31/20  [provider]    Allergies    Oat  Review of Systems   Review of Systems  Constitutional: Positive for fever.  Gastrointestinal: Positive for vomiting. Negative for diarrhea.  All other systems reviewed and are negative.   Physical Exam Updated Vital Signs BP 119/73   Pulse 69   Temp 98.1 F (36.7 C)  Resp 18   Ht 5\' 7"  (1.702 m)   Wt 72.6 kg   SpO2 99%   BMI 25.06 kg/m   Physical Exam Vitals and nursing note reviewed.  Constitutional:      General: She is not in acute distress.    Appearance: She is well-developed and well-nourished. She is not diaphoretic.  HENT:     Head: Normocephalic and atraumatic.  Cardiovascular:     Rate and Rhythm: Normal rate and regular rhythm.     Heart sounds: No murmur heard. No friction rub. No gallop.   Pulmonary:     Effort: Pulmonary effort is normal. No respiratory distress.     Breath sounds: Normal breath sounds. No wheezing.  Abdominal:     General: Bowel sounds are normal. There is no distension.     Palpations: Abdomen is soft.     Tenderness: There is no abdominal tenderness.  Musculoskeletal:        General: Normal range of motion.     Cervical back: Normal range of motion and neck supple.  Skin:    General: Skin is warm and dry.   Neurological:     Mental Status: She is alert and oriented to person, place, and time.     ED Results / Procedures / Treatments   Labs (all labs ordered are listed, but only abnormal results are displayed) Labs Reviewed  SARS CORONAVIRUS 2 (TAT 6-24 HRS)  COMPREHENSIVE METABOLIC PANEL  LIPASE, BLOOD  CBC WITH DIFFERENTIAL/PLATELET  URINALYSIS, ROUTINE W REFLEX MICROSCOPIC  PREGNANCY, URINE    EKG None  Radiology No results found.  Procedures Procedures (including critical care time)  Medications Ordered in ED Medications  sodium chloride 0.9 % bolus 1,000 mL (has no administration in time range)  ondansetron (ZOFRAN) injection 4 mg (has no administration in time range)  ketorolac (TORADOL) 30 MG/ML injection 30 mg (has no administration in time range)    ED Course  I have reviewed the triage vital signs and the nursing notes.  Pertinent labs & imaging results that were available during my care of the patient were reviewed by me and considered in my medical decision making (see chart for details).    MDM Rules/Calculators/A&P  Patient presenting here with complaints of nausea and vomiting since noon.  Patient tells me she feels lightheaded and had fever of 102 prior to coming here.  Patient is afebrile here in the ER with stable vital signs.  Patient given IV fluids along with antiemetics.    I returned to the patient's exam room to reassess her and asked if she was feeling better.  She replied "kind of, maybe, no".  She tells me she continued to feel nauseated.  At this point, I had planned to give an additional liter of saline along with Phenergan and would reassess her afterward.  Shortly after I left the room, the patient run for her nurse.  She was requesting to have her IV removed so she could leave.  The IV was removed at the patient's request and she ambulated from the department and left prior to additional interventions I ordered and prior to my having the  opportunity to speak with her again.  When I had reassessed her, patient expressed frustration with not allowing her boyfriend to be in the room with her due to COVID restrictions.  She was upset that we would leave him in the car in the parking lot "freezing to death".  I assume this is the reason  that she eloped.  Final Clinical Impression(s) / ED Diagnoses Final diagnoses:  None    Rx / DC Orders ED Discharge Orders    None       Geoffery Lyons, MD 11/05/20 [phone]

## 2020-11-05 NOTE — ED Notes (Signed)
Pt requests IV to be removed so she can leave, does not want to stay for further antiemetic or IV fluids. Saline Lock removed, MD notified.

## 2020-11-05 NOTE — ED Triage Notes (Signed)
Pt c/o emesis and fever since 1100 11/04/20

## 2021-08-28 ENCOUNTER — Ambulatory Visit: Admission: EM | Admit: 2021-08-28 | Discharge: 2021-08-28 | Payer: Medicaid Other

## 2021-08-28 NOTE — ED Triage Notes (Signed)
Patient asked if we had an ultra sound here and was told no.  Patient was informed that we would be sending her to the ED.  Patient stated she was going to the ED and did not want to waste her time here if we could not help her .

## 2021-10-02 IMAGING — CT CT RENAL STONE PROTOCOL
3 of 4 series · 8 of 46 positions shown, 15 images · non-contrast
Comparison: None.

CLINICAL DATA: Hematuria. Vaginal pain and bleeding for 5 days.
Painful urination with increased frequency.

EXAM:
CT ABDOMEN AND PELVIS WITHOUT CONTRAST
TECHNIQUE: Multidetector CT imaging of the abdomen and pelvis was performed
following the standard protocol without IV contrast.

[Series 4: lung bases · axial · 0.74mm/px · z∈[-874,-814]mm · 4 of 22 slices shown, 9 images]
[im 5/22  soft-tissue]
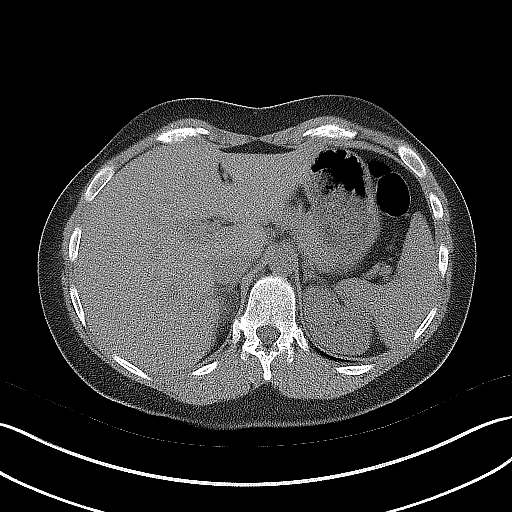
[im 5/22  lung]
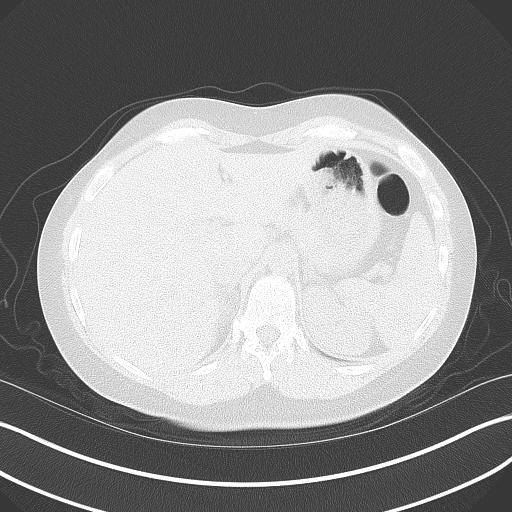
[im 5/22  bone]
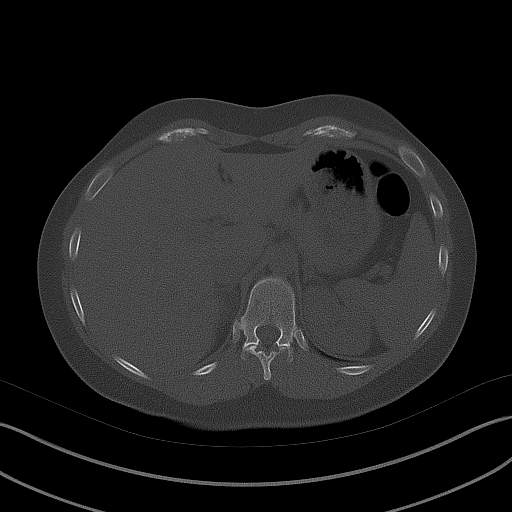
[im 9/22  soft-tissue]
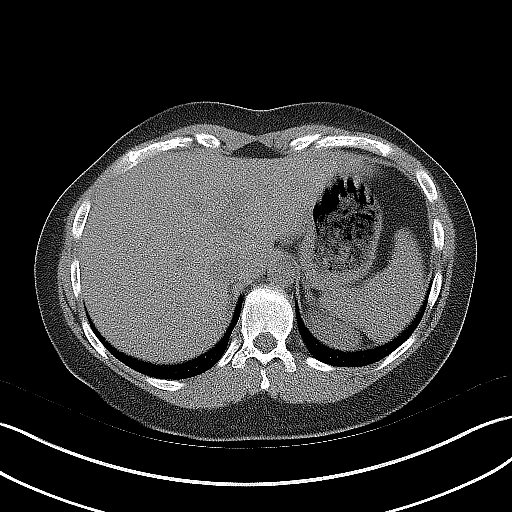
[im 9/22  lung]
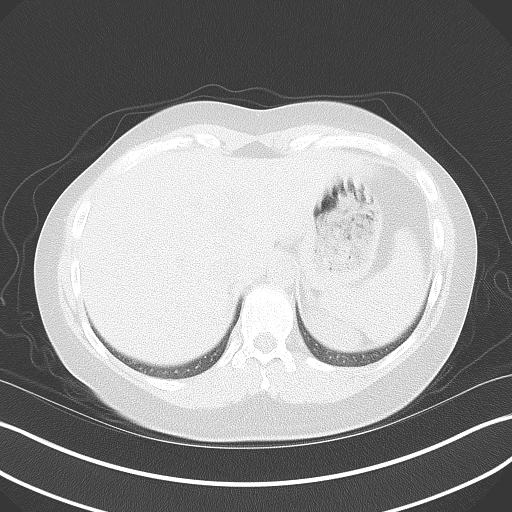
[im 13/22  soft-tissue]
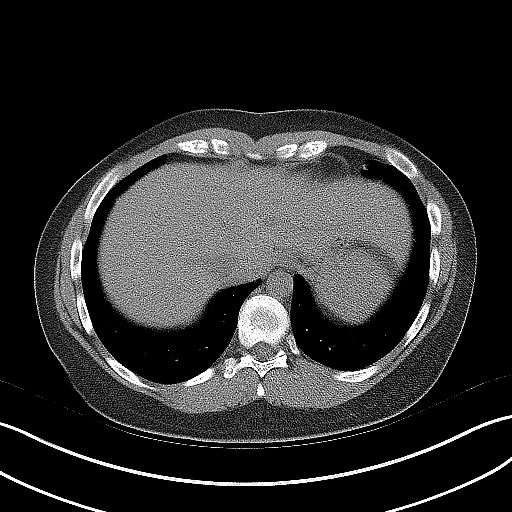
[im 13/22  lung]
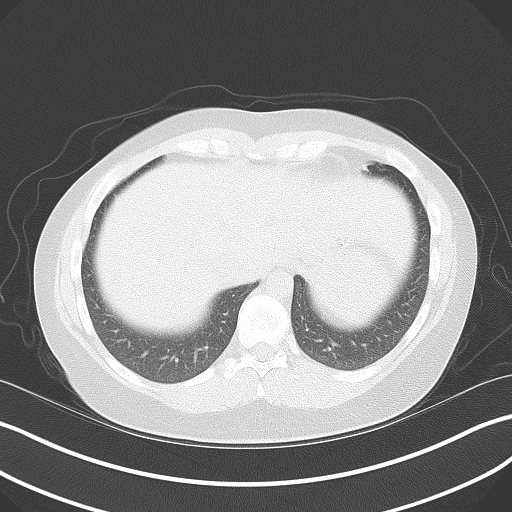
[im 17/22  soft-tissue]
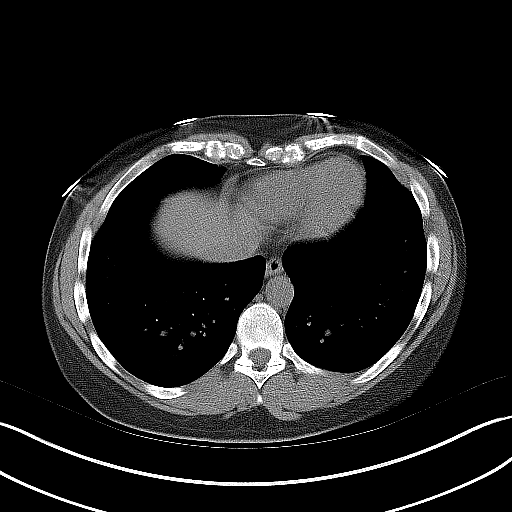
[im 17/22  lung]
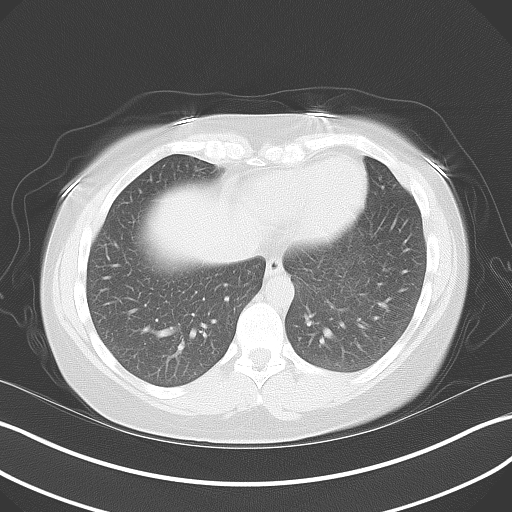

[Series 5: coronal · coronal · 0.71mm/px · 3 of 121 slices shown, 4 images]
[im 41/121  soft-tissue]
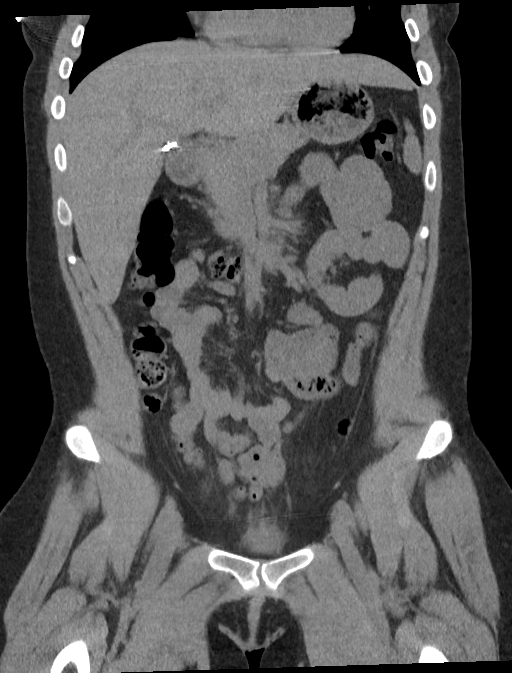
[im 54/121  soft-tissue]
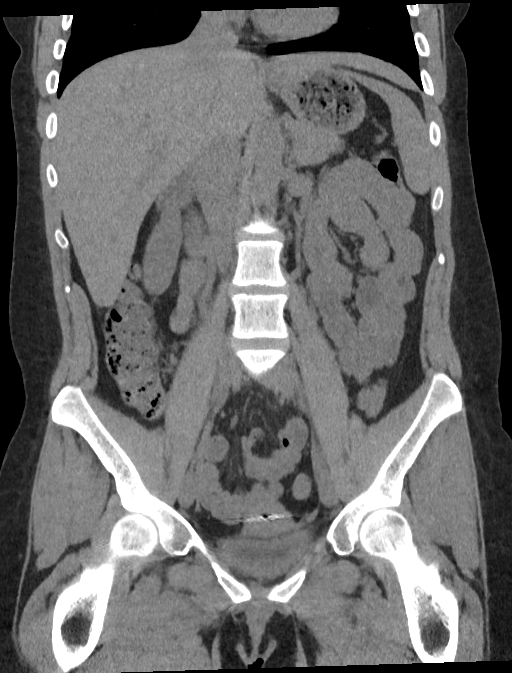
[im 54/121  bone]
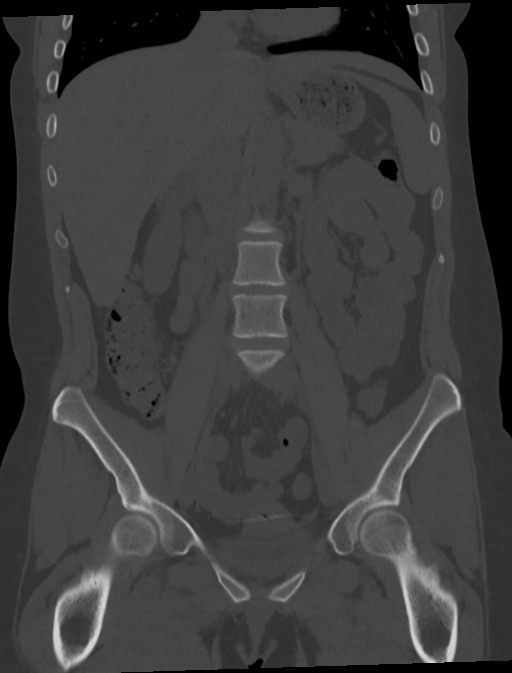
[im 67/121  soft-tissue]
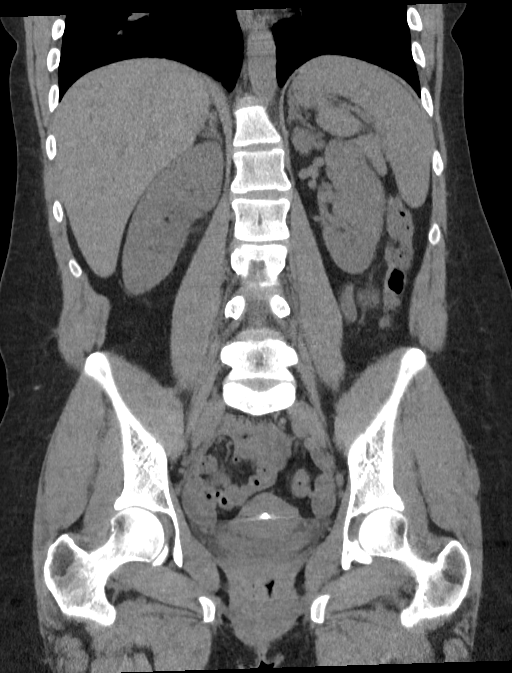

[Series 6: sagittal · sagittal · 0.47mm/px · 1 of 183 slices shown, 2 images]
[im 61/183  soft-tissue]
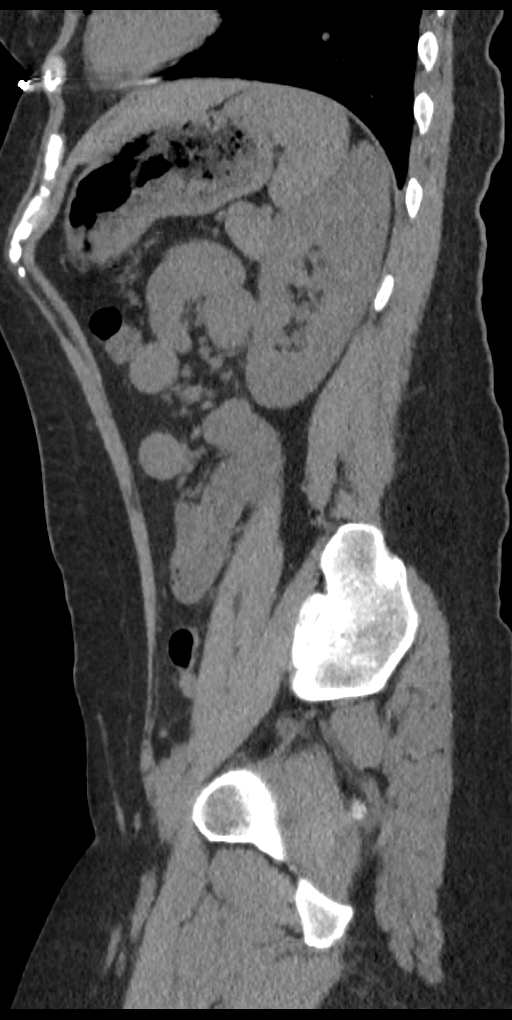
[im 61/183  bone]
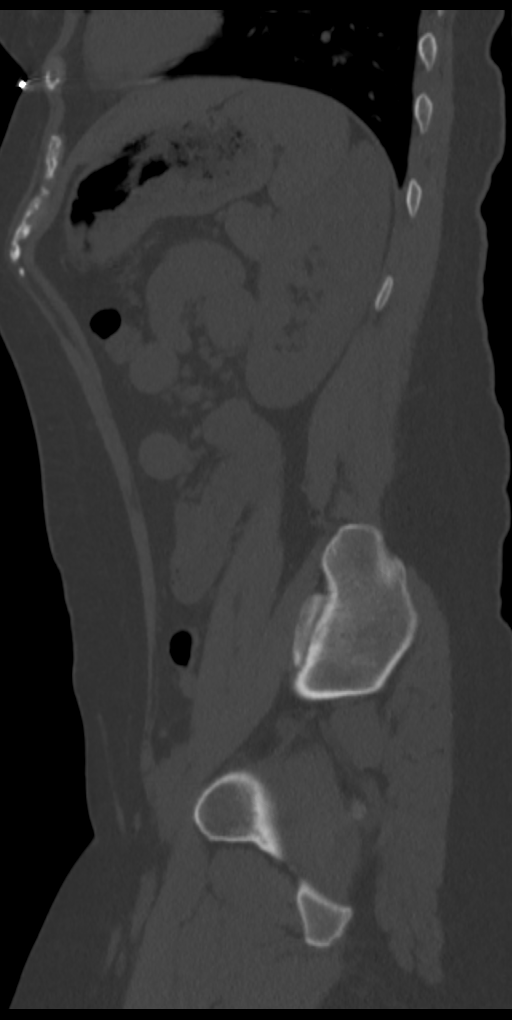

[8 of 46 positions shown; findings below may reference images not displayed]

FINDINGS: Lower chest: No acute abnormality.

Hepatobiliary: No focal liver abnormality is seen. Status post
cholecystectomy. There are clips within the gallbladder fossa.
Additionally, there is a clip in the subhepatic space which likely
represents a dropped clip. No biliary dilation.

Pancreas: Unremarkable. No pancreatic ductal dilatation or
surrounding inflammatory changes.

Spleen: Normal in size without focal abnormality.

Adrenals/Urinary Tract: Normal adrenal glands. There is mild
stranding surrounding the right ureter throughout its course. No
radiodense renal or ureteric calculi identified. A calcification in
the right anatomic pelvis on series 2, image 77 is favored to be
posterior to the right ureter and represent a phlebolith. No
hydronephrosis. There is circumferential bladder wall thickening.

Stomach/Bowel: Stomach is within normal limits. Appendix appears
normal. No evidence of bowel wall thickening, distention, or
inflammatory changes.

Vascular/Lymphatic: No significant vascular findings are present. No
enlarged abdominal or pelvic lymph nodes.

Reproductive: Uterus and bilateral adnexa are unremarkable. IUD in
place.

Other: No abdominal wall hernia or abnormality. Small amount of free
fluid in the anatomic pelvis.

Musculoskeletal: No acute osseous abnormality.
IMPRESSION: Circumferential bladder wall thickening with mild stranding
surrounding the right ureter throughout its course. Evaluation is
limited in the absence of IV contrast; however, these findings can
be seen in the setting of a urinary tract infection with ascending
infection. Recommend correlation with urinalysis. No radiodense
renal or ureteric calculi identified. No hydronephrosis.

## 2022-11-05 ENCOUNTER — Other Ambulatory Visit: Payer: Self-pay

## 2022-11-05 ENCOUNTER — Emergency Department (HOSPITAL_COMMUNITY)
Admission: EM | Admit: 2022-11-05 | Discharge: 2022-11-05 | Disposition: A | Discharge status: Home / Self Care | Payer: Medicaid Other | Attending: Emergency Medicine | Admitting: Emergency Medicine

## 2022-11-05 ENCOUNTER — Emergency Department (HOSPITAL_COMMUNITY): Payer: Medicaid Other

## 2022-11-05 DIAGNOSIS — R109 Unspecified abdominal pain: Secondary | ICD-10-CM

## 2022-11-05 DIAGNOSIS — F17210 Nicotine dependence, cigarettes, uncomplicated: Secondary | ICD-10-CM | POA: Diagnosis not present

## 2022-11-05 DIAGNOSIS — N12 Tubulo-interstitial nephritis, not specified as acute or chronic: Secondary | ICD-10-CM | POA: Diagnosis not present

## 2022-11-05 LAB — URINALYSIS, ROUTINE W REFLEX MICROSCOPIC
Bilirubin Urine: NEGATIVE
Glucose, UA: NEGATIVE mg/dL
Ketones, ur: NEGATIVE mg/dL
Nitrite: POSITIVE — AB
Protein, ur: 30 mg/dL — AB
Specific Gravity, Urine: 1.017 (ref 1.005–1.030)
WBC, UA: >50 WBC/hpf — ABNORMAL HIGH (ref 0–5)
pH: 6 (ref 5.0–8.0)

## 2022-11-05 LAB — CBC
HCT: 37.3 % (ref 36.0–46.0)
Hemoglobin: 12 g/dL (ref 12.0–15.0)
MCH: 26.3 pg (ref 26.0–34.0)
MCHC: 32.2 g/dL (ref 30.0–36.0)
MCV: 81.8 fL (ref 80.0–100.0)
Platelets: 299 10*3/uL (ref 150–400)
RBC: 4.56 MIL/uL (ref 3.87–5.11)
RDW: 15 % (ref 11.5–15.5)
WBC: 14.4 10*3/uL — ABNORMAL HIGH (ref 4.0–10.5)
nRBC: 0 % (ref 0.0–0.2)

## 2022-11-05 LAB — BASIC METABOLIC PANEL
Anion gap: 9 (ref 5–15)
BUN: 10 mg/dL (ref 6–20)
CO2: 22 mmol/L (ref 22–32)
Calcium: 8.4 mg/dL — ABNORMAL LOW (ref 8.9–10.3)
Chloride: 102 mmol/L (ref 98–111)
Creatinine, Ser: 0.66 mg/dL (ref 0.44–1.00)
GFR, Estimated: >60 mL/min (ref >60)
Glucose, Bld: 111 mg/dL — ABNORMAL HIGH (ref 70–99)
Potassium: 3.6 mmol/L (ref 3.5–5.1)
Sodium: 133 mmol/L — ABNORMAL LOW (ref 135–145)

## 2022-11-05 LAB — POC URINE PREG, ED: Preg Test, Ur: NEGATIVE

## 2022-11-05 MED ORDER — HALOPERIDOL LACTATE 5 MG/ML IJ SOLN
2.0000 mg | Freq: Once | INTRAMUSCULAR | Status: DC
  Filled 2022-11-05: qty 1

## 2022-11-05 MED ORDER — CEPHALEXIN 500 MG PO CAPS: 500.0000 mg | ORAL_CAPSULE | Freq: Four times a day (QID) | ORAL | 0 refills | Status: AC

## 2022-11-05 MED ORDER — IOHEXOL 300 MG/ML  SOLN
100.0000 mL | Freq: Once | INTRAMUSCULAR | Status: AC | PRN
  Administered 2022-11-05: 100 mL via INTRAVENOUS

## 2022-11-05 MED ORDER — NAPROXEN 500 MG PO TABS: 500.0000 mg | ORAL_TABLET | Freq: Two times a day (BID) | ORAL | 0 refills | Status: AC

## 2022-11-05 MED ORDER — SODIUM CHLORIDE 0.9 % IV SOLN
2.0000 g | Freq: Once | INTRAVENOUS | Status: AC
  Administered 2022-11-05: 2 g via INTRAVENOUS
  Filled 2022-11-05: qty 20

## 2022-11-05 MED ORDER — SODIUM CHLORIDE 0.9 % IV BOLUS
1000.0000 mL | Freq: Once | INTRAVENOUS | Status: AC
  Administered 2022-11-05: 1000 mL via INTRAVENOUS

## 2022-11-05 MED ORDER — HYDROMORPHONE HCL 1 MG/ML IJ SOLN
0.5000 mg | Freq: Once | INTRAMUSCULAR | Status: AC
  Administered 2022-11-05: 0.5 mg via INTRAVENOUS
  Filled 2022-11-05: qty 0.5

## 2022-11-05 MED ORDER — ONDANSETRON HCL 4 MG/2ML IJ SOLN
4.0000 mg | Freq: Once | INTRAMUSCULAR | Status: AC
  Administered 2022-11-05: 4 mg via INTRAVENOUS
  Filled 2022-11-05: qty 2

## 2022-11-05 MED ORDER — HYDROMORPHONE HCL 1 MG/ML IJ SOLN
1.0000 mg | Freq: Once | INTRAMUSCULAR | Status: AC
  Administered 2022-11-05: 1 mg via INTRAVENOUS
  Filled 2022-11-05: qty 1

## 2022-11-05 NOTE — ED Triage Notes (Addendum)
Pt c/o lower abdominal pain and fever x3 days. Highest temp at home 101.0. Pt was seen at [facility] for same and  tested negative for COVID/Flu. Pt states her urine has been dark and has foul odor. Denies any other urinary symptoms

## 2022-11-05 NOTE — ED Provider Notes (Signed)
[facility]-EMERGENCY DEPT [facility] Emergency Department Provider Note MRN:  [id number]  Arrival date & time: 11/05/22     Chief Complaint   Abdominal Pain   History of Present Illness   Nichole Winters is a 32 y.o. year-old female with no reported past medical history presenting to the ED with chief complaint of abdominal pain.  Pain to her abdomen and bilateral flanks for the past 3 days.  Also having fever on and off for the past 3 days up to 101 at home.  Some dark urine, some vaginal spotting.  Review of Systems  A thorough review of systems was obtained and all systems are negative except as noted in the HPI and PMH.   Patient's Health History    Past Medical History:  Diagnosis Date   Anxiety 12/23/2016   Breast mass 2015   right breast   Genital herpes 12/23/2016   Major depression 12/23/2016   Migraine     Past Surgical History:  Procedure Laterality Date   CHOLECYSTECTOMY      Family History  Problem Relation Age of Onset   Ovarian cancer Paternal Grandmother 12   Hypertension Paternal Grandmother    CVA Paternal Grandmother    Heart attack Paternal Grandmother    Paranoid behavior Mother    Schizophrenia Mother     Social History   Socioeconomic History   Marital status: Single    Spouse name: Not on file   Number of children: 2   Years of education: Not on file   Highest education level: Not on file  Occupational History   Not on file  Tobacco Use   Smoking status: Every Day    Packs/day: 0.25    Years: 1.00    Total pack years: 0.25    Types: Cigarettes   Smokeless tobacco: Never  Vaping Use   Vaping Use: Never used  Substance and Sexual Activity   Alcohol use: Yes    Comment: social   Drug use: Never   Sexual activity: Yes    Birth control/protection: None  Other Topics Concern   Not on file  Social History Narrative   Not on file   Social Determinants of Health   Financial Resource Strain: Low Risk  (08/29/2020)   Overall  Financial Resource Strain (CARDIA)    Difficulty of Paying Living Expenses: Not very hard  Food Insecurity: Unknown (08/29/2020)   Hunger Vital Sign    Worried About Running Out of Food in the Last Year: Patient refused    Ran Out of Food in the Last Year: Patient refused  Transportation Needs: No Transportation Needs (08/29/2020)   PRAPARE - Administrator, Civil Service (Medical): No    Lack of Transportation (Non-Medical): No  Physical Activity: Sufficiently Active (08/29/2020)   Exercise Vital Sign    Days of Exercise per Week: 7 days    Minutes of Exercise per Session: 30 min  Stress: No Stress Concern Present (08/29/2020)   [employer] of Occupational Health - Occupational Stress Questionnaire    Feeling of Stress : Only a little  Social Connections: Socially Isolated (08/29/2020)   Social Connection and Isolation Panel [NHANES]    Frequency of Communication with Friends and Family: More than three times a week    Frequency of Social Gatherings with Friends and Family: More than three times a week    Attends Religious Services: Never    Database administrator or Organizations: No    Attends [employer]  or Organization Meetings: Patient refused    Marital Status: Never married  Intimate Partner Violence: Not At Risk (08/29/2020)   Humiliation, Afraid, Rape, and Kick questionnaire    Fear of Current or Ex-Partner: No    Emotionally Abused: No    Physically Abused: No    Sexually Abused: No     Physical Exam   Vitals:   11/05/22 0515 11/05/22 0530  BP: 99/62 107/72  Pulse: 83 85  Resp: 19 17  Temp:    SpO2: 99% 98%    CONSTITUTIONAL: Well-appearing, NAD NEURO/PSYCH:  Alert and oriented x 3, no focal deficits EYES:  eyes equal and reactive ENT/NECK:  no LAD, no JVD CARDIO: Regular rate, well-perfused, normal S1 and S2 PULM:  CTAB no wheezing or rhonchi GI/GU:  non-distended, non-tender MSK/SPINE:  No gross deformities, no edema SKIN:  no rash,  atraumatic   *Additional and/or pertinent findings included in MDM below  Diagnostic and Interventional Summary    EKG Interpretation  Date/Time:    Ventricular Rate:    PR Interval:    QRS Duration:   QT Interval:    QTC Calculation:   R Axis:     Text Interpretation:         Labs Reviewed  URINALYSIS, ROUTINE W REFLEX MICROSCOPIC - Abnormal; Notable for the following components:      Result Value   APPearance HAZY (*)    Hgb urine dipstick MODERATE (*)    Protein, ur 30 (*)    Nitrite POSITIVE (*)    Leukocytes,Ua SMALL (*)    WBC, UA >50 (*)    Bacteria, UA MANY (*)    All other components within normal limits  CBC - Abnormal; Notable for the following components:   WBC 14.4 (*)    All other components within normal limits  BASIC METABOLIC PANEL - Abnormal; Notable for the following components:   Sodium 133 (*)    Glucose, Bld 111 (*)    Calcium 8.4 (*)    All other components within normal limits  POC URINE PREG, ED    CT ABDOMEN PELVIS W CONTRAST  Final Result      Medications  haloperidol lactate (HALDOL) injection 2 mg (0 mg Intravenous Hold 11/05/22 0631)  sodium chloride 0.9 % bolus 1,000 mL (0 mLs Intravenous Stopped 11/05/22 0430)  cefTRIAXone (ROCEPHIN) 2 g in sodium chloride 0.9 % 100 mL IVPB (0 g Intravenous Stopped 11/05/22 0430)  HYDROmorphone (DILAUDID) injection 0.5 mg (0.5 mg Intravenous Given 11/05/22 0407)  iohexol (OMNIPAQUE) 300 MG/ML solution 100 mL (100 mLs Intravenous Contrast Given 11/05/22 0355)  ondansetron (ZOFRAN) injection 4 mg (4 mg Intravenous Given 11/05/22 0513)  HYDROmorphone (DILAUDID) injection 1 mg (1 mg Intravenous Given 11/05/22 0513)  sodium chloride 0.9 % bolus 1,000 mL (1,000 mLs Intravenous New Bag/Given 11/05/22 0631)     Procedures  /  Critical Care Procedures  ED Course and Medical Decision Making  Initial Impression and Ddx Differential diagnosis includes pyelonephritis, UTI, less likely appendicitis.  Awaiting  labs, urinalysis.  Past medical/surgical history that increases complexity of ED encounter: None  Interpretation of Diagnostics I personally reviewed the laboratory assessment and my interpretation is as follows: Leukocytosis as well as nitrate positive urinalysis suspicious for infection, otherwise no significant blood count or electrolyte disturbance    Patient Reassessment and Ultimate Disposition/Management     Patient had continued abdominal pain with tenderness to the right lower quadrant and so there was lingering suspicion for appendicitis with  pyuria.  CT imaging obtained revealing normal appendix, evidence of pyelonephritis.  Patient is now feeling better after medications listed above, wants to go home.  Appropriate for discharge.  Patient management required discussion with the following services or consulting groups:  None  Complexity of Problems Addressed Acute illness or injury that poses threat of life of bodily function  Additional Data Reviewed and Analyzed Further history obtained from: Further history from spouse/family member  Additional Factors Impacting ED Encounter Risk Prescriptions and Consideration of hospitalization  Barth Kirks. Sedonia Small, [place] [email or web address]  Final Clinical Impressions(s) / ED Diagnoses     ICD-10-CM   1. Abdominal pain, unspecified abdominal location  R10.9     2. Pyelonephritis  N12       ED Discharge Orders          Ordered    naproxen (NAPROSYN) 500 MG tablet  2 times daily        11/05/22 0634    cephALEXin (KEFLEX) 500 MG capsule  4 times daily        11/05/22 1324             Discharge Instructions Discussed with and Provided to Patient:     Discharge Instructions      You were evaluated in the Emergency Department and after careful evaluation, we did not find any emergent condition requiring admission or further testing in the hospital.  Your  exam/testing today is overall reassuring.  Symptoms seem to be due to a kidney infection.  Take the Keflex antibiotic as directed to clear the infection.  Use the Naprosyn twice daily for pain.  Please return to the Emergency Department if you experience any worsening of your condition.   Thank you for allowing [place] to be a part of your care.       Maudie Flakes, MD 11/05/22 [phone]

## 2022-11-05 NOTE — Discharge Instructions (Signed)
You were evaluated in the Emergency Department and after careful evaluation, we did not find any emergent condition requiring admission or further testing in the hospital.  Your exam/testing today is overall reassuring.  Symptoms seem to be due to a kidney infection.  Take the Keflex antibiotic as directed to clear the infection.  Use the Naprosyn twice daily for pain.  Please return to the Emergency Department if you experience any worsening of your condition.   Thank you for allowing [place] to be a part of your care.

## 2022-11-05 NOTE — ED Notes (Signed)
Patient transported to CT 

## 2023-06-08 ENCOUNTER — Emergency Department (HOSPITAL_COMMUNITY): Payer: Medicaid Other

## 2023-06-08 ENCOUNTER — Other Ambulatory Visit: Payer: Self-pay

## 2023-06-08 ENCOUNTER — Encounter (HOSPITAL_COMMUNITY): Payer: Self-pay

## 2023-06-08 ENCOUNTER — Emergency Department (HOSPITAL_COMMUNITY)
Admission: EM | Admit: 2023-06-08 | Discharge: 2023-06-08 | Disposition: A | Discharge status: Home / Self Care | Payer: Medicaid Other | Attending: Emergency Medicine | Admitting: Emergency Medicine

## 2023-06-08 DIAGNOSIS — S91312A Laceration without foreign body, left foot, initial encounter: Secondary | ICD-10-CM | POA: Diagnosis not present

## 2023-06-08 DIAGNOSIS — S99922A Unspecified injury of left foot, initial encounter: Secondary | ICD-10-CM | POA: Diagnosis present

## 2023-06-08 DIAGNOSIS — W268XXA Contact with other sharp object(s), not elsewhere classified, initial encounter: Secondary | ICD-10-CM | POA: Insufficient documentation

## 2023-06-08 MED ORDER — BACITRACIN ZINC 500 UNIT/GM EX OINT: 1.0000 | TOPICAL_OINTMENT | Freq: Two times a day (BID) | CUTANEOUS | 0 refills | Status: AC

## 2023-06-08 MED ORDER — LIDOCAINE-EPINEPHRINE (PF) 2 %-1:200000 IJ SOLN
10.0000 mL | Freq: Once | INTRAMUSCULAR | Status: AC
  Administered 2023-06-08: 10 mL
  Filled 2023-06-08: qty 20

## 2023-06-08 MED ORDER — LIDOCAINE-EPINEPHRINE-TETRACAINE (LET) TOPICAL GEL
3.0000 mL | Freq: Once | TOPICAL | Status: AC
  Administered 2023-06-08: 3 mL via TOPICAL
  Filled 2023-06-08: qty 3

## 2023-06-08 MED ORDER — BACITRACIN ZINC 500 UNIT/GM EX OINT
TOPICAL_OINTMENT | Freq: Two times a day (BID) | CUTANEOUS | Status: DC
  Administered 2023-06-08: 1 via TOPICAL
  Filled 2023-06-08: qty 0.9

## 2023-06-08 NOTE — ED Triage Notes (Signed)
Pt comes in with a laceration to left foot. Pt states cutting her foot on a tuna can.

## 2023-06-08 NOTE — Discharge Instructions (Addendum)
We saw you in the ER for your WOUND. Please read the instructions provided on wound care. The laceration has been repaired with nylon stitches that will need to come out in 7 to 10 days. Keep the area clean and dry, apply bacitracin ointment daily and take the medications provided. RETURN TO THE ER IF THERE IS INCREASED PAIN, REDNESS, PUS COMING OUT from the wound site.

## 2023-06-08 NOTE — ED Notes (Signed)
Ointment and dressing applied.

## 2023-06-08 NOTE — ED Provider Notes (Signed)
[place] EMERGENCY DEPARTMENT AT [facility] Provider Note   CSN: [id number] Arrival date & time: 06/08/23  7829     History  Chief Complaint  Patient presents with   Laceration    Nichole Winters is a 32 y.o. female.  HPI    32 year old female comes in with chief complaint of laceration.  Patient states that she excellently struck her head onto the lid of a tuna can.  In the process she has laceration to her left foot.  Patient is up-to-date with her tetanus.  She denies any numbness or tingling around the wound site.  No significant bleeding.  Home Medications Prior to Admission medications   Medication Sig Start Date End Date Taking? Authorizing Provider  bacitracin ointment Apply 1 Application topically 2 (two) times daily. 06/08/23  Yes Derwood Kaplan, MD  Multiple Vitamin (MULTIVITAMIN) tablet Take 1 tablet by mouth daily.    [provider]  naproxen (NAPROSYN) 500 MG tablet Take 1 tablet (500 mg total) by mouth 2 (two) times daily. 11/05/22   Sabas Sous, MD  levonorgestrel (MIRENA) 20 MCG/24HR IUD 1 each by Intrauterine route once.  07/31/20  [provider]      Allergies    Oat    Review of Systems   Review of Systems  Physical Exam Updated Vital Signs BP 117/86 (BP Location: Right Arm)   Pulse 84   Temp 97.6 F (36.4 C) (Oral)   Resp 16   Ht 5\' 6"  (1.676 m)   Wt 87.6 kg   LMP 03/31/2022 (Approximate)   SpO2 98%   BMI 31.17 kg/m  Physical Exam Vitals and nursing note reviewed.  Constitutional:      Appearance: She is well-developed.  HENT:     Head: Atraumatic.  Cardiovascular:     Rate and Rhythm: Normal rate.  Pulmonary:     Effort: Pulmonary effort is normal.  Musculoskeletal:     Cervical back: Neck supple.  Skin:    General: Skin is warm and dry.     Comments: Patient has a 5 cm laceration, with a flap over the left foot dorsally  Neurological:     Mental Status: She is alert and oriented to person,  place, and time.     ED Results / Procedures / Treatments   Labs (all labs ordered are listed, but only abnormal results are displayed) Labs Reviewed - No data to display  EKG None  Radiology DG Foot 2 Views Left  Result Date: 06/08/2023 CLINICAL DATA:  Foot laceration on a metal can EXAM: LEFT FOOT - 2 VIEW COMPARISON:  None Available. FINDINGS: There is no evidence of fracture or dislocation. Dorsal forefoot skin wound. No opaque foreign body. IMPRESSION: No fracture or foreign body seen at the skin wound. Electronically Signed   By: Tiburcio Pea M.D.   On: 06/08/2023 07:44    Procedures .Marland KitchenLaceration Repair  Date/Time: 06/08/2023 9:07 AM  Performed by: Derwood Kaplan, MD Authorized by: Derwood Kaplan, MD   Consent:    Consent obtained:  Verbal   Consent given by:  Patient   Risks, benefits, and alternatives were discussed: yes     Risks discussed:  Infection, pain, poor cosmetic result, need for additional repair, poor wound healing and nerve damage   Alternatives discussed:  No treatment Universal protocol:    Procedure explained and questions answered to patient or proxy's satisfaction: yes     Immediately prior to procedure, a time out was called:  yes     Patient identity confirmed:  Arm band Anesthesia:    Anesthesia method:  Topical application and local infiltration   Topical anesthetic:  LET   Local anesthetic:  Lidocaine 2% WITH epi Laceration details:    Location:  Foot   Foot location:  Top of L foot   Length (cm):  5   Depth (mm):  3 Pre-procedure details:    Preparation:  Patient was prepped and draped in usual sterile fashion Exploration:    Limited defect created (wound extended): yes     Hemostasis achieved with:  Direct pressure   Imaging outcome: foreign body not noted     Wound exploration: wound explored through full range of motion     Wound extent: no foreign body, no signs of injury, no tendon damage and no vascular damage      Contaminated: yes   Treatment:    Area cleansed with:  Saline   Amount of cleaning:  Extensive   Irrigation solution:  Tap water   Irrigation volume:  100   Irrigation method:  Syringe and pressure wash   Debridement:  None   Undermining:  None   Scar revision: no   Skin repair:    Repair method:  Sutures   Suture size:  4-0   Suture material:  Nylon   Suture technique:  Simple interrupted   Number of sutures:  6 Approximation:    Approximation:  Close Repair type:    Repair type:  Intermediate Post-procedure details:    Dressing:  Antibiotic ointment and adhesive bandage   Procedure completion:  Tolerated well, no immediate complications     Medications Ordered in ED Medications  bacitracin ointment (has no administration in time range)  lidocaine-EPINEPHrine (XYLOCAINE W/EPI) 2 %-1:200000 (PF) injection 10 mL (10 mLs Infiltration Given 06/08/23 0734)  lidocaine-EPINEPHrine-tetracaine (LET) topical gel (3 mLs Topical Given 06/08/23 2841)    ED Course/ Medical Decision Making/ A&P                                 Medical Decision Making Amount and/or Complexity of Data Reviewed Radiology: ordered.  Risk OTC drugs. Prescription drug management.   32 year old female comes in with chief complaint of laceration to her left foot.  She has 3 cm laceration, superficial, no tendon or ligament injury.  The wound was copiously irrigated.  Suture has been applied, patient has nylon stitches placed.  X-ray of the foot was ordered, independently interpreted.  There is no evidence of fracture or foreign body.  Differential diagnosis for this patient essentially includes laceration, foreign body, open fracture, soft tissue injury, hematoma. Final Clinical Impression(s) / ED Diagnoses Final diagnoses:  Laceration of left foot, initial encounter    Rx / DC Orders ED Discharge Orders          Ordered    bacitracin ointment  2 times daily        06/08/23 3244               Derwood Kaplan, MD 06/08/23 0930

## 2024-09-09 ENCOUNTER — Other Ambulatory Visit: Payer: Self-pay

## 2024-09-09 ENCOUNTER — Emergency Department (HOSPITAL_COMMUNITY)

## 2024-09-09 ENCOUNTER — Encounter (HOSPITAL_COMMUNITY): Payer: Self-pay

## 2024-09-09 ENCOUNTER — Emergency Department (HOSPITAL_COMMUNITY)
Admission: EM | Admit: 2024-09-09 | Discharge: 2024-09-09 | Disposition: A | Discharge status: Home / Self Care | Attending: Emergency Medicine | Admitting: Emergency Medicine

## 2024-09-09 DIAGNOSIS — S300XXA Contusion of lower back and pelvis, initial encounter: Secondary | ICD-10-CM | POA: Insufficient documentation

## 2024-09-09 DIAGNOSIS — Y92410 Unspecified street and highway as the place of occurrence of the external cause: Secondary | ICD-10-CM | POA: Diagnosis not present

## 2024-09-09 DIAGNOSIS — M62830 Muscle spasm of back: Secondary | ICD-10-CM | POA: Insufficient documentation

## 2024-09-09 DIAGNOSIS — M2391 Unspecified internal derangement of right knee: Secondary | ICD-10-CM | POA: Diagnosis not present

## 2024-09-09 DIAGNOSIS — Z79899 Other long term (current) drug therapy: Secondary | ICD-10-CM | POA: Diagnosis not present

## 2024-09-09 DIAGNOSIS — S3992XA Unspecified injury of lower back, initial encounter: Secondary | ICD-10-CM | POA: Diagnosis present

## 2024-09-09 LAB — PREGNANCY, URINE: Preg Test, Ur: NEGATIVE

## 2024-09-09 LAB — URINALYSIS, ROUTINE W REFLEX MICROSCOPIC
Bilirubin Urine: NEGATIVE
Glucose, UA: NEGATIVE mg/dL
Ketones, ur: NEGATIVE mg/dL
Nitrite: NEGATIVE
Protein, ur: NEGATIVE mg/dL
Specific Gravity, Urine: 1.01 (ref 1.005–1.030)
pH: 6 (ref 5.0–8.0)

## 2024-09-09 MED ORDER — CYCLOBENZAPRINE HCL 10 MG PO TABS: 10.0000 mg | ORAL_TABLET | Freq: Two times a day (BID) | ORAL | 0 refills | Status: AC | PRN

## 2024-09-09 MED ORDER — PREDNISONE 50 MG PO TABS: 50.0000 mg | ORAL_TABLET | Freq: Every day | ORAL | 0 refills | Status: AC
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
## 2024-09-19 ENCOUNTER — Emergency Department (HOSPITAL_COMMUNITY): Admission: EM | Admit: 2024-09-19 | Discharge: 2024-09-19 | Disposition: A | Discharge status: Home / Self Care

## 2024-09-19 ENCOUNTER — Other Ambulatory Visit: Payer: Self-pay

## 2024-09-19 DIAGNOSIS — G44209 Tension-type headache, unspecified, not intractable: Secondary | ICD-10-CM | POA: Insufficient documentation

## 2024-09-19 DIAGNOSIS — R059 Cough, unspecified: Secondary | ICD-10-CM | POA: Diagnosis present

## 2024-09-19 DIAGNOSIS — B349 Viral infection, unspecified: Secondary | ICD-10-CM | POA: Insufficient documentation

## 2024-09-19 LAB — COMPREHENSIVE METABOLIC PANEL WITH GFR
ALT: 34 U/L (ref 0–44)
AST: 25 U/L (ref 15–41)
Albumin: 4.6 g/dL (ref 3.5–5.0)
Alkaline Phosphatase: 90 U/L (ref 38–126)
Anion gap: 11 (ref 5–15)
BUN: 9 mg/dL (ref 6–20)
CO2: 22 mmol/L (ref 22–32)
Calcium: 8.9 mg/dL (ref 8.9–10.3)
Chloride: 106 mmol/L (ref 98–111)
Creatinine, Ser: 0.5 mg/dL (ref 0.44–1.00)
GFR, Estimated: >60 mL/min (ref >60)
Glucose, Bld: 98 mg/dL (ref 70–99)
Potassium: 3.9 mmol/L (ref 3.5–5.1)
Sodium: 139 mmol/L (ref 135–145)
Total Bilirubin: 0.5 mg/dL (ref 0.0–1.2)
Total Protein: 7.2 g/dL (ref 6.5–8.1)

## 2024-09-19 LAB — CBC WITH DIFFERENTIAL/PLATELET
Abs Immature Granulocytes: 0.03 K/uL (ref 0.00–0.07)
Basophils Absolute: 0.1 K/uL (ref 0.0–0.1)
Basophils Relative: 1 %
Eosinophils Absolute: 0.2 K/uL (ref 0.0–0.5)
Eosinophils Relative: 2 %
HCT: 42.4 % (ref 36.0–46.0)
Hemoglobin: 13.8 g/dL (ref 12.0–15.0)
Immature Granulocytes: 0 %
Lymphocytes Relative: 20 %
Lymphs Abs: 2.3 K/uL (ref 0.7–4.0)
MCH: 28 pg (ref 26.0–34.0)
MCHC: 32.5 g/dL (ref 30.0–36.0)
MCV: 86 fL (ref 80.0–100.0)
Monocytes Absolute: 0.9 K/uL (ref 0.1–1.0)
Monocytes Relative: 8 %
Neutro Abs: 7.8 K/uL — ABNORMAL HIGH (ref 1.7–7.7)
Neutrophils Relative %: 69 %
Platelets: 316 K/uL (ref 150–400)
RBC: 4.93 MIL/uL (ref 3.87–5.11)
RDW: 13.2 % (ref 11.5–15.5)
WBC: 11.3 K/uL — ABNORMAL HIGH (ref 4.0–10.5)
nRBC: 0 % (ref 0.0–0.2)

## 2024-09-19 LAB — HCG, SERUM, QUALITATIVE: Preg, Serum: NEGATIVE

## 2024-09-19 LAB — URINALYSIS, ROUTINE W REFLEX MICROSCOPIC
Bacteria, UA: NONE SEEN
Bilirubin Urine: NEGATIVE
Glucose, UA: NEGATIVE mg/dL
Ketones, ur: NEGATIVE mg/dL
Leukocytes,Ua: NEGATIVE
Nitrite: NEGATIVE
Protein, ur: NEGATIVE mg/dL
Specific Gravity, Urine: 1.008 (ref 1.005–1.030)
pH: 7 (ref 5.0–8.0)

## 2024-09-19 LAB — RESP PANEL BY RT-PCR (RSV, FLU A&B, COVID)  RVPGX2
Influenza A by PCR: NEGATIVE
Influenza B by PCR: NEGATIVE
Resp Syncytial Virus by PCR: NEGATIVE
SARS Coronavirus 2 by RT PCR: NEGATIVE

## 2024-09-19 LAB — LIPASE, BLOOD: Lipase: 18 U/L (ref 11–51)

## 2024-09-19 MED ORDER — SODIUM CHLORIDE 0.9 % IV BOLUS
1000.0000 mL | Freq: Once | INTRAVENOUS | Status: AC
  Administered 2024-09-19: 1000 mL via INTRAVENOUS

## 2024-09-19 MED ORDER — KETOROLAC TROMETHAMINE 15 MG/ML IJ SOLN
15.0000 mg | Freq: Once | INTRAMUSCULAR | Status: AC
  Administered 2024-09-19: 15 mg via INTRAVENOUS
  Filled 2024-09-19: qty 1

## 2024-09-19 MED ORDER — DIPHENHYDRAMINE HCL 50 MG/ML IJ SOLN
25.0000 mg | Freq: Once | INTRAMUSCULAR | Status: AC
  Administered 2024-09-19: 25 mg via INTRAVENOUS
  Filled 2024-09-19: qty 1

## 2024-09-19 MED ORDER — SODIUM CHLORIDE 0.9 % IV BOLUS: 1000.0000 mL | Freq: Once | INTRAVENOUS | Status: DC

## 2024-09-19 MED ORDER — ONDANSETRON HCL 4 MG/2ML IJ SOLN
4.0000 mg | Freq: Once | INTRAMUSCULAR | Status: AC
  Administered 2024-09-19: 4 mg via INTRAVENOUS
  Filled 2024-09-19: qty 2

## 2024-09-19 MED ORDER — PROCHLORPERAZINE EDISYLATE 10 MG/2ML IJ SOLN
10.0000 mg | Freq: Once | INTRAMUSCULAR | Status: AC
  Administered 2024-09-19: 10 mg via INTRAVENOUS
  Filled 2024-09-19: qty 2

## 2024-09-19 MED ORDER — ONDANSETRON HCL 4 MG PO TABS: 4.0000 mg | ORAL_TABLET | Freq: Four times a day (QID) | ORAL | 0 refills | Status: AC

## 2024-09-19 MED ORDER — DEXAMETHASONE SODIUM PHOSPHATE 4 MG/ML IJ SOLN
4.0000 mg | Freq: Once | INTRAMUSCULAR | Status: AC
  Administered 2024-09-19: 4 mg via INTRAVENOUS
  Filled 2024-09-19: qty 1

## 2024-09-19 NOTE — Discharge Instructions (Addendum)
 For pain, you can take 1000 mg of Tylenol  or 1 g of Tylenol  every 6-8 hours.  Do not exceed more than 4000 mg or 4 g in a 24-hour period.  You can also take ibuprofen  600 to 800 mg every 6-8 hours as well.  Do not take this high-dose ibuprofen  for greater than a week.  Please drink plenty of fluid.  Come back to the ED have any kind of worsening headache, vision changes, fever, chills.  Did call in some Zofran  for you if you have any kind of nausea.  This is likely a viral illness.  Please stay well-hydrated
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
## 2024-09-19 NOTE — ED Triage Notes (Signed)
 Pt arrived via POV. Pt complains of flu like sx that have been going on since 5:30 this morning. Pt denies being around anyone that's been sick. Pt states that she just does not feel right She thought she had a migraine but now she thinks she might have the flu. No fever on assessment.

## 2024-11-04 ENCOUNTER — Encounter (HOSPITAL_COMMUNITY): Payer: Self-pay

## 2024-11-04 ENCOUNTER — Ambulatory Visit (HOSPITAL_COMMUNITY): Attending: Student

## 2024-11-04 DIAGNOSIS — M25562 Pain in left knee: Secondary | ICD-10-CM | POA: Insufficient documentation

## 2024-11-04 DIAGNOSIS — M6281 Muscle weakness (generalized): Secondary | ICD-10-CM | POA: Insufficient documentation

## 2024-11-04 DIAGNOSIS — M25561 Pain in right knee: Secondary | ICD-10-CM | POA: Diagnosis present
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
## 2024-11-08 ENCOUNTER — Encounter (HOSPITAL_COMMUNITY): Payer: Self-pay | Admitting: Physical Therapy

## 2024-11-08 ENCOUNTER — Ambulatory Visit (HOSPITAL_COMMUNITY): Admitting: Physical Therapy

## 2024-11-08 DIAGNOSIS — M25561 Pain in right knee: Secondary | ICD-10-CM

## 2024-11-08 DIAGNOSIS — M25562 Pain in left knee: Secondary | ICD-10-CM

## 2024-11-08 DIAGNOSIS — M6281 Muscle weakness (generalized): Secondary | ICD-10-CM
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
## 2024-11-16 ENCOUNTER — Ambulatory Visit (HOSPITAL_COMMUNITY)

## 2024-11-23 ENCOUNTER — Encounter (HOSPITAL_COMMUNITY): Payer: Self-pay

## 2024-11-23 ENCOUNTER — Ambulatory Visit (HOSPITAL_COMMUNITY)

## 2024-11-23 DIAGNOSIS — M25562 Pain in left knee: Secondary | ICD-10-CM

## 2024-11-23 DIAGNOSIS — M25561 Pain in right knee: Secondary | ICD-10-CM

## 2024-11-23 DIAGNOSIS — M6281 Muscle weakness (generalized): Secondary | ICD-10-CM
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
## 2024-11-28 ENCOUNTER — Ambulatory Visit (HOSPITAL_COMMUNITY)

## 2024-11-30 ENCOUNTER — Ambulatory Visit (HOSPITAL_COMMUNITY): Admitting: Physical Therapy

## 2024-12-05 ENCOUNTER — Ambulatory Visit (HOSPITAL_COMMUNITY): Admitting: Physical Therapy

## 2024-12-07 ENCOUNTER — Ambulatory Visit (HOSPITAL_COMMUNITY): Admitting: Physical Therapy

## 2024-12-12 ENCOUNTER — Ambulatory Visit (HOSPITAL_COMMUNITY)

## 2024-12-14 ENCOUNTER — Ambulatory Visit (HOSPITAL_COMMUNITY)

## 2024-12-19 ENCOUNTER — Ambulatory Visit (HOSPITAL_COMMUNITY): Admitting: Physical Therapy

## 2024-12-21 ENCOUNTER — Ambulatory Visit (HOSPITAL_COMMUNITY)
